# Patient Record
Sex: Male | Born: 1995 | Race: Black or African American | Hispanic: No | Marital: Single | Smoking: Never smoker
Health system: Southern US, Community
[De-identification: ages and names within clinical notes are randomized; demographics above are authoritative.]

## PROBLEM LIST (undated history)

## (undated) DIAGNOSIS — T8859XA Other complications of anesthesia, initial encounter: Secondary | ICD-10-CM

## (undated) DIAGNOSIS — S82209A Unspecified fracture of shaft of unspecified tibia, initial encounter for closed fracture: Secondary | ICD-10-CM

## (undated) HISTORY — PX: DENTAL SURGERY: SHX609

---
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
## 2021-06-21 DIAGNOSIS — T2047XA Corrosion of unspecified degree of neck, initial encounter: Secondary | ICD-10-CM | POA: Insufficient documentation

## 2022-06-27 ENCOUNTER — Ambulatory Visit
Admission: EM | Admit: 2022-06-27 | Discharge: 2022-06-27 | Disposition: A | Discharge status: Home / Self Care | Payer: 59

## 2022-06-27 ENCOUNTER — Emergency Department: Payer: 59

## 2022-06-27 ENCOUNTER — Other Ambulatory Visit: Payer: Self-pay

## 2022-06-27 ENCOUNTER — Encounter: Payer: Self-pay | Admitting: *Deleted

## 2022-06-27 DIAGNOSIS — M25532 Pain in left wrist: Secondary | ICD-10-CM | POA: Diagnosis not present

## 2022-06-27 DIAGNOSIS — S0990XA Unspecified injury of head, initial encounter: Secondary | ICD-10-CM | POA: Diagnosis present
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
## 2022-06-28 ENCOUNTER — Emergency Department
Admission: EM | Admit: 2022-06-28 | Discharge: 2022-06-28 | Disposition: A | Discharge status: Home / Self Care | Payer: 59 | Attending: Emergency Medicine | Admitting: Emergency Medicine

## 2022-06-28 ENCOUNTER — Emergency Department: Payer: 59

## 2022-06-28 DIAGNOSIS — S0990XA Unspecified injury of head, initial encounter: Secondary | ICD-10-CM

## 2022-06-28 DIAGNOSIS — M25532 Pain in left wrist: Secondary | ICD-10-CM

## 2022-06-28 MED ORDER — ACETAMINOPHEN 500 MG PO TABS
1000.0000 mg | ORAL_TABLET | Freq: Once | ORAL | Status: AC
  Filled 2022-06-28: qty 2

## 2022-06-28 MED ORDER — KETOROLAC TROMETHAMINE 30 MG/ML IJ SOLN
30.0000 mg | Freq: Once | INTRAMUSCULAR | Status: AC
  Administered 2022-06-28: 30 mg via INTRAMUSCULAR
  Filled 2022-06-28: qty 1

## 2022-06-28 MED ORDER — OXYCODONE HCL 5 MG PO TABS
5.0000 mg | ORAL_TABLET | Freq: Once | ORAL | Status: AC
  Administered 2022-06-28: 5 mg via ORAL
  Filled 2022-06-28: qty 1

## 2022-06-28 MED ORDER — ACETAMINOPHEN 325 MG PO TABS
ORAL_TABLET | ORAL | Status: AC
  Administered 2022-06-28: 1000 mg via ORAL
  Filled 2022-06-28: qty 2

## 2022-06-28 NOTE — Discharge Instructions (Addendum)
Please take Tylenol and ibuprofen/Advil for your pain.  It is safe to take them together, or to alternate them every few hours.  Take up to 1000mg of Tylenol at a time, up to 4 times per day.  Do not take more than 4000 mg of Tylenol in 24 hours.  For ibuprofen, take 400-600 mg, 3 - 4 times per day.  
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
## 2022-10-04 ENCOUNTER — Emergency Department (HOSPITAL_COMMUNITY): Payer: No Typology Code available for payment source

## 2022-10-04 ENCOUNTER — Other Ambulatory Visit: Payer: Self-pay

## 2022-10-04 ENCOUNTER — Inpatient Hospital Stay (HOSPITAL_COMMUNITY)
Admission: EM | Admit: 2022-10-04 | Discharge: 2022-10-08 | DRG: 563 | Disposition: A | Discharge status: Home / Self Care | Payer: No Typology Code available for payment source | Attending: Internal Medicine | Admitting: Internal Medicine

## 2022-10-04 ENCOUNTER — Encounter (HOSPITAL_COMMUNITY): Payer: Self-pay

## 2022-10-04 DIAGNOSIS — S82831G Other fracture of upper and lower end of right fibula, subsequent encounter for closed fracture with delayed healing: Secondary | ICD-10-CM

## 2022-10-04 DIAGNOSIS — S93324A Dislocation of tarsometatarsal joint of right foot, initial encounter: Secondary | ICD-10-CM | POA: Diagnosis not present

## 2022-10-04 DIAGNOSIS — Z560 Unemployment, unspecified: Secondary | ICD-10-CM

## 2022-10-04 DIAGNOSIS — S93601A Unspecified sprain of right foot, initial encounter: Secondary | ICD-10-CM | POA: Insufficient documentation

## 2022-10-04 DIAGNOSIS — D72828 Other elevated white blood cell count: Secondary | ICD-10-CM | POA: Diagnosis present

## 2022-10-04 DIAGNOSIS — S060X0A Concussion without loss of consciousness, initial encounter: Secondary | ICD-10-CM | POA: Diagnosis present

## 2022-10-04 DIAGNOSIS — K5900 Constipation, unspecified: Secondary | ICD-10-CM | POA: Diagnosis not present

## 2022-10-04 DIAGNOSIS — S92901A Unspecified fracture of right foot, initial encounter for closed fracture: Secondary | ICD-10-CM

## 2022-10-04 DIAGNOSIS — S20211A Contusion of right front wall of thorax, initial encounter: Secondary | ICD-10-CM | POA: Diagnosis present

## 2022-10-04 DIAGNOSIS — S82831A Other fracture of upper and lower end of right fibula, initial encounter for closed fracture: Principal | ICD-10-CM | POA: Diagnosis present

## 2022-10-04 DIAGNOSIS — S060XAA Concussion with loss of consciousness status unknown, initial encounter: Secondary | ICD-10-CM

## 2022-10-04 DIAGNOSIS — Y92410 Unspecified street and highway as the place of occurrence of the external cause: Secondary | ICD-10-CM

## 2022-10-04 LAB — RAPID URINE DRUG SCREEN, HOSP PERFORMED
Amphetamines: NOT DETECTED
Barbiturates: NOT DETECTED
Benzodiazepines: NOT DETECTED
Cocaine: NOT DETECTED
Opiates: NOT DETECTED
Tetrahydrocannabinol: NOT DETECTED

## 2022-10-04 LAB — CBC
HCT: 46.1 % (ref 39.0–52.0)
Hemoglobin: 14 g/dL (ref 13.0–17.0)
MCH: 22 pg — ABNORMAL LOW (ref 26.0–34.0)
MCHC: 30.4 g/dL (ref 30.0–36.0)
MCV: 72.4 fL — ABNORMAL LOW (ref 80.0–100.0)
Platelets: 298 10*3/uL (ref 150–400)
RBC: 6.37 MIL/uL — ABNORMAL HIGH (ref 4.22–5.81)
RDW: 16.2 % — ABNORMAL HIGH (ref 11.5–15.5)
WBC: 9.7 10*3/uL (ref 4.0–10.5)
nRBC: 0 % (ref 0.0–0.2)

## 2022-10-04 LAB — I-STAT CHEM 8, ED
BUN: 7 mg/dL (ref 6–20)
Calcium, Ion: 1.17 mmol/L (ref 1.15–1.40)
Chloride: 105 mmol/L (ref 98–111)
Creatinine, Ser: 1.2 mg/dL (ref 0.61–1.24)
Glucose, Bld: 115 mg/dL — ABNORMAL HIGH (ref 70–99)
HCT: 48 % (ref 39.0–52.0)
Hemoglobin: 16.3 g/dL (ref 13.0–17.0)
Potassium: 3.5 mmol/L (ref 3.5–5.1)
Sodium: 141 mmol/L (ref 135–145)
TCO2: 27 mmol/L (ref 22–32)

## 2022-10-04 LAB — COMPREHENSIVE METABOLIC PANEL
ALT: 15 U/L (ref 0–44)
AST: 21 U/L (ref 15–41)
Albumin: 3.9 g/dL (ref 3.5–5.0)
Alkaline Phosphatase: 71 U/L (ref 38–126)
Anion gap: 9 (ref 5–15)
BUN: 8 mg/dL (ref 6–20)
CO2: 24 mmol/L (ref 22–32)
Calcium: 9.1 mg/dL (ref 8.9–10.3)
Chloride: 104 mmol/L (ref 98–111)
Creatinine, Ser: 1.16 mg/dL (ref 0.61–1.24)
GFR, Estimated: >60 mL/min (ref >60)
Glucose, Bld: 108 mg/dL — ABNORMAL HIGH (ref 70–99)
Potassium: 3.6 mmol/L (ref 3.5–5.1)
Sodium: 137 mmol/L (ref 135–145)
Total Bilirubin: 0.7 mg/dL (ref 0.3–1.2)
Total Protein: 7.2 g/dL (ref 6.5–8.1)

## 2022-10-04 LAB — URINALYSIS, ROUTINE W REFLEX MICROSCOPIC
Bilirubin Urine: NEGATIVE
Glucose, UA: NEGATIVE mg/dL
Hgb urine dipstick: NEGATIVE
Ketones, ur: NEGATIVE mg/dL
Leukocytes,Ua: NEGATIVE
Nitrite: NEGATIVE
Protein, ur: NEGATIVE mg/dL
Specific Gravity, Urine: >1.046 — ABNORMAL HIGH (ref 1.005–1.030)
pH: 6 (ref 5.0–8.0)

## 2022-10-04 LAB — PROTIME-INR
INR: 1 (ref 0.8–1.2)
Prothrombin Time: 13.7 seconds (ref 11.4–15.2)

## 2022-10-04 LAB — SAMPLE TO BLOOD BANK

## 2022-10-04 LAB — ETHANOL: Alcohol, Ethyl (B): <10 mg/dL (ref <10)

## 2022-10-04 LAB — LACTIC ACID, PLASMA: Lactic Acid, Venous: 1.9 mmol/L (ref 0.5–1.9)

## 2022-10-04 MED ORDER — HYDROMORPHONE HCL 1 MG/ML IJ SOLN
1.0000 mg | Freq: Once | INTRAMUSCULAR | Status: AC
  Administered 2022-10-04: 1 mg via INTRAVENOUS
  Filled 2022-10-04: qty 1

## 2022-10-04 MED ORDER — KETOROLAC TROMETHAMINE 15 MG/ML IJ SOLN
15.0000 mg | Freq: Once | INTRAMUSCULAR | Status: AC
  Administered 2022-10-04: 15 mg via INTRAVENOUS
  Filled 2022-10-04: qty 1

## 2022-10-04 MED ORDER — FENTANYL CITRATE PF 50 MCG/ML IJ SOSY
100.0000 ug | PREFILLED_SYRINGE | Freq: Once | INTRAMUSCULAR | Status: AC
  Administered 2022-10-04: 100 ug via INTRAVENOUS
  Filled 2022-10-04: qty 2

## 2022-10-04 MED ORDER — TETANUS-DIPHTH-ACELL PERTUSSIS 5-2.5-18.5 LF-MCG/0.5 IM SUSY
0.5000 mL | PREFILLED_SYRINGE | Freq: Once | INTRAMUSCULAR | Status: DC
  Filled 2022-10-04: qty 0.5

## 2022-10-04 MED ORDER — IOHEXOL 350 MG/ML SOLN
75.0000 mL | Freq: Once | INTRAVENOUS | Status: AC | PRN
  Administered 2022-10-04: 75 mL via INTRAVENOUS

## 2022-10-04 MED ORDER — OXYCODONE HCL 5 MG PO TABS: 5.0000 mg | ORAL_TABLET | ORAL | 0 refills | Status: DC | PRN

## 2022-10-04 MED ORDER — ONDANSETRON HCL 4 MG/2ML IJ SOLN
4.0000 mg | Freq: Once | INTRAMUSCULAR | Status: AC
  Administered 2022-10-04: 4 mg via INTRAVENOUS
  Filled 2022-10-04: qty 2

## 2022-10-04 MED ORDER — OXYCODONE HCL 5 MG PO TABS
5.0000 mg | ORAL_TABLET | ORAL | Status: AC
  Administered 2022-10-04: 5 mg via ORAL
  Filled 2022-10-04: qty 1

## 2022-10-04 MED ORDER — HYDROMORPHONE HCL 1 MG/ML IJ SOLN
0.5000 mg | Freq: Once | INTRAMUSCULAR | Status: AC
  Administered 2022-10-04: 0.5 mg via INTRAVENOUS
  Filled 2022-10-04: qty 1

## 2022-10-04 NOTE — ED Notes (Signed)
Pt returned from CT placed back on monitor. Pt remains in c-collar at this time. VSS,NAD.
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
## 2022-10-04 NOTE — Progress Notes (Signed)
Orthopedic Tech Progress Note Patient Details:  Wesley Schmidt December 17, 1995 [id number]  Applied short leg splint and gave patient crutches. Ortho Devices Type of Ortho Device: Short leg splint, Crutches Ortho Device/Splint Location: RLE Ortho Device/Splint Interventions: Ordered, Application, Adjustment   Post Interventions Patient Tolerated: Well Instructions Provided: Care of device  Blase Mess 10/04/2022, 7:17 PM

## 2022-10-04 NOTE — ED Notes (Signed)
MD Eloise Harman at bedside evaluating pt.
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
## 2022-10-04 NOTE — ED Notes (Signed)
Pt transported to CT via stretcher with RN. C-spine precautions remain intact at this time. Pt a&ox4,vss,nad.

## 2022-10-04 NOTE — ED Notes (Signed)
Pt alert and oriented to 4, c/o being exhausted. Vitals wnl.

## 2022-10-04 NOTE — ED Notes (Signed)
[employer] officer at bedside  
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
## 2022-10-05 ENCOUNTER — Encounter (HOSPITAL_COMMUNITY): Payer: Self-pay | Admitting: Internal Medicine

## 2022-10-05 DIAGNOSIS — K5900 Constipation, unspecified: Secondary | ICD-10-CM | POA: Diagnosis not present

## 2022-10-05 DIAGNOSIS — S82831A Other fracture of upper and lower end of right fibula, initial encounter for closed fracture: Secondary | ICD-10-CM | POA: Diagnosis present

## 2022-10-05 DIAGNOSIS — S20211A Contusion of right front wall of thorax, initial encounter: Secondary | ICD-10-CM

## 2022-10-05 DIAGNOSIS — S93324A Dislocation of tarsometatarsal joint of right foot, initial encounter: Secondary | ICD-10-CM | POA: Diagnosis present

## 2022-10-05 DIAGNOSIS — Z560 Unemployment, unspecified: Secondary | ICD-10-CM | POA: Diagnosis not present

## 2022-10-05 DIAGNOSIS — S92901A Unspecified fracture of right foot, initial encounter for closed fracture: Secondary | ICD-10-CM

## 2022-10-05 DIAGNOSIS — Y92410 Unspecified street and highway as the place of occurrence of the external cause: Secondary | ICD-10-CM | POA: Diagnosis not present

## 2022-10-05 DIAGNOSIS — D72828 Other elevated white blood cell count: Secondary | ICD-10-CM | POA: Diagnosis present

## 2022-10-05 DIAGNOSIS — S82831G Other fracture of upper and lower end of right fibula, subsequent encounter for closed fracture with delayed healing: Secondary | ICD-10-CM

## 2022-10-05 DIAGNOSIS — S82831D Other fracture of upper and lower end of right fibula, subsequent encounter for closed fracture with routine healing: Secondary | ICD-10-CM

## 2022-10-05 DIAGNOSIS — S060X0A Concussion without loss of consciousness, initial encounter: Secondary | ICD-10-CM | POA: Diagnosis present

## 2022-10-05 HISTORY — DX: Contusion of right front wall of thorax, initial encounter: S20.211A

## 2022-10-05 LAB — CBC WITH DIFFERENTIAL/PLATELET
Abs Immature Granulocytes: 0.06 10*3/uL (ref 0.00–0.07)
Basophils Absolute: 0 10*3/uL (ref 0.0–0.1)
Basophils Relative: 0 %
Eosinophils Absolute: 0 10*3/uL (ref 0.0–0.5)
Eosinophils Relative: 0 %
HCT: 46.6 % (ref 39.0–52.0)
Hemoglobin: 14.3 g/dL (ref 13.0–17.0)
Immature Granulocytes: 0 %
Lymphocytes Relative: 4 %
Lymphs Abs: 0.7 10*3/uL (ref 0.7–4.0)
MCH: 23 pg — ABNORMAL LOW (ref 26.0–34.0)
MCHC: 30.7 g/dL (ref 30.0–36.0)
MCV: 74.8 fL — ABNORMAL LOW (ref 80.0–100.0)
Monocytes Absolute: 0.9 10*3/uL (ref 0.1–1.0)
Monocytes Relative: 6 %
Neutro Abs: 13.4 10*3/uL — ABNORMAL HIGH (ref 1.7–7.7)
Neutrophils Relative %: 90 %
Platelets: 217 10*3/uL (ref 150–400)
RBC: 6.23 MIL/uL — ABNORMAL HIGH (ref 4.22–5.81)
RDW: 15.9 % — ABNORMAL HIGH (ref 11.5–15.5)
WBC: 15 10*3/uL — ABNORMAL HIGH (ref 4.0–10.5)
nRBC: 0 % (ref 0.0–0.2)

## 2022-10-05 LAB — BASIC METABOLIC PANEL
Anion gap: 12 (ref 5–15)
BUN: 6 mg/dL (ref 6–20)
CO2: 23 mmol/L (ref 22–32)
Calcium: 9 mg/dL (ref 8.9–10.3)
Chloride: 102 mmol/L (ref 98–111)
Creatinine, Ser: 1.12 mg/dL (ref 0.61–1.24)
GFR, Estimated: >60 mL/min (ref >60)
Glucose, Bld: 133 mg/dL — ABNORMAL HIGH (ref 70–99)
Potassium: 3.7 mmol/L (ref 3.5–5.1)
Sodium: 137 mmol/L (ref 135–145)

## 2022-10-05 LAB — MAGNESIUM: Magnesium: 1.7 mg/dL (ref 1.7–2.4)

## 2022-10-05 MED ORDER — ONDANSETRON HCL 4 MG/2ML IJ SOLN: 4.0000 mg | Freq: Three times a day (TID) | INTRAMUSCULAR | Status: DC | PRN

## 2022-10-05 MED ORDER — KETOROLAC TROMETHAMINE 15 MG/ML IJ SOLN: 15.0000 mg | Freq: Four times a day (QID) | INTRAMUSCULAR | Status: DC | PRN

## 2022-10-05 MED ORDER — SODIUM CHLORIDE 0.9 % IV SOLN
INTRAVENOUS | Status: DC
  Administered 2022-10-05: 125 mL/h via INTRAVENOUS

## 2022-10-05 MED ORDER — PANTOPRAZOLE SODIUM 40 MG PO TBEC
40.0000 mg | DELAYED_RELEASE_TABLET | Freq: Every day | ORAL | Status: DC
  Administered 2022-10-05 – 2022-10-08 (×4): 40 mg via ORAL
  Filled 2022-10-05 (×4): qty 1

## 2022-10-05 MED ORDER — IBUPROFEN 200 MG PO TABS: 600.0000 mg | ORAL_TABLET | Freq: Four times a day (QID) | ORAL | Status: DC | PRN

## 2022-10-05 MED ORDER — ACETAMINOPHEN 325 MG PO TABS
650.0000 mg | ORAL_TABLET | Freq: Four times a day (QID) | ORAL | Status: DC | PRN
  Administered 2022-10-07: 650 mg via ORAL
  Filled 2022-10-05: qty 2

## 2022-10-05 MED ORDER — HYDROMORPHONE HCL 1 MG/ML IJ SOLN
1.0000 mg | INTRAMUSCULAR | Status: DC | PRN
  Administered 2022-10-05 (×2): 1 mg via INTRAVENOUS
  Filled 2022-10-05 (×2): qty 1

## 2022-10-05 MED ORDER — ONDANSETRON HCL 4 MG/2ML IJ SOLN
4.0000 mg | Freq: Four times a day (QID) | INTRAMUSCULAR | Status: DC | PRN
  Administered 2022-10-05 – 2022-10-07 (×3): 4 mg via INTRAVENOUS
  Filled 2022-10-05 (×4): qty 2

## 2022-10-05 MED ORDER — IBUPROFEN 200 MG PO TABS
600.0000 mg | ORAL_TABLET | Freq: Three times a day (TID) | ORAL | Status: DC
  Administered 2022-10-05 – 2022-10-08 (×10): 600 mg via ORAL
  Filled 2022-10-05 (×10): qty 3

## 2022-10-05 MED ORDER — HYDROMORPHONE HCL 1 MG/ML IJ SOLN: 1.0000 mg | INTRAMUSCULAR | Status: DC | PRN

## 2022-10-05 MED ORDER — OXYCODONE HCL 5 MG PO TABS
5.0000 mg | ORAL_TABLET | ORAL | Status: DC | PRN
  Administered 2022-10-05 – 2022-10-06 (×2): 5 mg via ORAL
  Administered 2022-10-06 – 2022-10-07 (×4): 10 mg via ORAL
  Filled 2022-10-05 (×3): qty 1
  Filled 2022-10-05 (×4): qty 2

## 2022-10-05 MED ORDER — OXYCODONE HCL 5 MG PO TABS: 5.0000 mg | ORAL_TABLET | ORAL | Status: DC | PRN

## 2022-10-05 MED ORDER — ENOXAPARIN SODIUM 60 MG/0.6ML IJ SOSY
60.0000 mg | PREFILLED_SYRINGE | Freq: Every day | INTRAMUSCULAR | Status: DC
  Administered 2022-10-05 – 2022-10-07 (×3): 60 mg via SUBCUTANEOUS
  Filled 2022-10-05 (×3): qty 0.6

## 2022-10-05 MED ORDER — LORAZEPAM 2 MG/ML IJ SOLN: 0.5000 mg | Freq: Four times a day (QID) | INTRAMUSCULAR | Status: DC | PRN

## 2022-10-05 MED ORDER — KETOROLAC TROMETHAMINE 15 MG/ML IJ SOLN: 30.0000 mg | Freq: Four times a day (QID) | INTRAMUSCULAR | Status: DC | PRN

## 2022-10-05 MED ORDER — SODIUM CHLORIDE 0.9 % IV BOLUS
500.0000 mL | Freq: Once | INTRAVENOUS | Status: AC
  Administered 2022-10-05: 500 mL via INTRAVENOUS
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
## 2022-10-05 NOTE — Plan of Care (Signed)
  Problem: Education: Goal: Knowledge of General Education information will improve Description: Including pain rating scale, medication(s)/side effects and non-pharmacologic comfort measures Outcome: Progressing Patient educated on pain scale and medications. Patient used scale to rate pain.   Problem: Activity: Goal: Risk for activity intolerance will decrease Outcome: Progressing.    Problem: Nutrition: Goal: Adequate nutrition will be maintained Outcome: Progressing   Problem: Coping: Goal: Level of anxiety will decrease Outcome: Progressing
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
## 2022-10-05 NOTE — Progress Notes (Signed)
Patient is requesting girlfriend Jon Gills not longer be allowed to visit or have information regarding care. Consulting civil engineer notified. Patient password placed. Secretary placed as private.

## 2022-10-06 DIAGNOSIS — D72829 Elevated white blood cell count, unspecified: Secondary | ICD-10-CM

## 2022-10-06 DIAGNOSIS — K5900 Constipation, unspecified: Secondary | ICD-10-CM

## 2022-10-06 DIAGNOSIS — S20211D Contusion of right front wall of thorax, subsequent encounter: Secondary | ICD-10-CM

## 2022-10-06 LAB — BASIC METABOLIC PANEL
Anion gap: 7 (ref 5–15)
BUN: 9 mg/dL (ref 6–20)
CO2: 25 mmol/L (ref 22–32)
Calcium: 8.5 mg/dL — ABNORMAL LOW (ref 8.9–10.3)
Chloride: 105 mmol/L (ref 98–111)
Creatinine, Ser: 1.22 mg/dL (ref 0.61–1.24)
GFR, Estimated: >60 mL/min (ref >60)
Glucose, Bld: 101 mg/dL — ABNORMAL HIGH (ref 70–99)
Potassium: 4.2 mmol/L (ref 3.5–5.1)
Sodium: 137 mmol/L (ref 135–145)

## 2022-10-06 LAB — CBC WITH DIFFERENTIAL/PLATELET
Abs Immature Granulocytes: 0.02 10*3/uL (ref 0.00–0.07)
Basophils Absolute: 0 10*3/uL (ref 0.0–0.1)
Basophils Relative: 0 %
Eosinophils Absolute: 0.1 10*3/uL (ref 0.0–0.5)
Eosinophils Relative: 1 %
HCT: 39 % (ref 39.0–52.0)
Hemoglobin: 12.1 g/dL — ABNORMAL LOW (ref 13.0–17.0)
Immature Granulocytes: 0 %
Lymphocytes Relative: 21 %
Lymphs Abs: 1.9 10*3/uL (ref 0.7–4.0)
MCH: 22.9 pg — ABNORMAL LOW (ref 26.0–34.0)
MCHC: 31 g/dL (ref 30.0–36.0)
MCV: 73.9 fL — ABNORMAL LOW (ref 80.0–100.0)
Monocytes Absolute: 0.8 10*3/uL (ref 0.1–1.0)
Monocytes Relative: 9 %
Neutro Abs: 6.3 10*3/uL (ref 1.7–7.7)
Neutrophils Relative %: 69 %
Platelets: 196 10*3/uL (ref 150–400)
RBC: 5.28 MIL/uL (ref 4.22–5.81)
RDW: 15.4 % (ref 11.5–15.5)
WBC: 9.1 10*3/uL (ref 4.0–10.5)
nRBC: 0 % (ref 0.0–0.2)

## 2022-10-06 LAB — MAGNESIUM: Magnesium: 1.7 mg/dL (ref 1.7–2.4)

## 2022-10-06 MED ORDER — SORBITOL 70 % SOLN
30.0000 mL | Status: AC
  Administered 2022-10-06: 30 mL via ORAL
  Filled 2022-10-06 (×2): qty 30

## 2022-10-06 MED ORDER — SENNOSIDES-DOCUSATE SODIUM 8.6-50 MG PO TABS
1.0000 | ORAL_TABLET | Freq: Two times a day (BID) | ORAL | Status: DC
  Administered 2022-10-06 – 2022-10-07 (×3): 1 via ORAL
  Filled 2022-10-06 (×5): qty 1

## 2022-10-06 MED ORDER — POLYETHYLENE GLYCOL 3350 17 G PO PACK
17.0000 g | PACK | Freq: Every day | ORAL | Status: DC
  Administered 2022-10-06: 17 g via ORAL
  Filled 2022-10-06: qty 1

## 2022-10-06 MED ORDER — POLYETHYLENE GLYCOL 3350 17 G PO PACK
17.0000 g | PACK | Freq: Two times a day (BID) | ORAL | Status: DC
  Administered 2022-10-07 (×2): 17 g via ORAL
  Filled 2022-10-06 (×4): qty 1

## 2022-10-06 MED ORDER — MAGNESIUM SULFATE 2 GM/50ML IV SOLN
2.0000 g | Freq: Once | INTRAVENOUS | Status: AC
  Administered 2022-10-06: 2 g via INTRAVENOUS
  Filled 2022-10-06: qty 50
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
## 2022-10-07 ENCOUNTER — Other Ambulatory Visit (HOSPITAL_COMMUNITY): Payer: Self-pay

## 2022-10-07 LAB — CBC
HCT: 38.2 % — ABNORMAL LOW (ref 39.0–52.0)
Hemoglobin: 11.5 g/dL — ABNORMAL LOW (ref 13.0–17.0)
MCH: 22.1 pg — ABNORMAL LOW (ref 26.0–34.0)
MCHC: 30.1 g/dL (ref 30.0–36.0)
MCV: 73.5 fL — ABNORMAL LOW (ref 80.0–100.0)
Platelets: 198 10*3/uL (ref 150–400)
RBC: 5.2 MIL/uL (ref 4.22–5.81)
RDW: 15.3 % (ref 11.5–15.5)
WBC: 7.8 10*3/uL (ref 4.0–10.5)
nRBC: 0 % (ref 0.0–0.2)

## 2022-10-07 LAB — BASIC METABOLIC PANEL
Anion gap: 8 (ref 5–15)
BUN: 8 mg/dL (ref 6–20)
CO2: 24 mmol/L (ref 22–32)
Calcium: 8.7 mg/dL — ABNORMAL LOW (ref 8.9–10.3)
Chloride: 104 mmol/L (ref 98–111)
Creatinine, Ser: 1.14 mg/dL (ref 0.61–1.24)
GFR, Estimated: >60 mL/min (ref >60)
Glucose, Bld: 95 mg/dL (ref 70–99)
Potassium: 3.8 mmol/L (ref 3.5–5.1)
Sodium: 136 mmol/L (ref 135–145)

## 2022-10-07 LAB — MAGNESIUM: Magnesium: 2 mg/dL (ref 1.7–2.4)

## 2022-10-07 MED ORDER — SENNOSIDES-DOCUSATE SODIUM 8.6-50 MG PO TABS: 1.0000 | ORAL_TABLET | Freq: Two times a day (BID) | ORAL | Status: DC

## 2022-10-07 MED ORDER — ONDANSETRON HCL 4 MG PO TABS
4.0000 mg | ORAL_TABLET | Freq: Three times a day (TID) | ORAL | 0 refills | Status: DC | PRN
  Filled 2022-10-07: qty 20, 7d supply, fill #0

## 2022-10-07 MED ORDER — SORBITOL 70 % SOLN
30.0000 mL | Status: DC
  Filled 2022-10-07 (×2): qty 30

## 2022-10-07 MED ORDER — PANTOPRAZOLE SODIUM 40 MG PO TBEC
40.0000 mg | DELAYED_RELEASE_TABLET | Freq: Every day | ORAL | 1 refills | Status: AC
  Filled 2022-10-07: qty 30, 30d supply, fill #0

## 2022-10-07 MED ORDER — SORBITOL 70 % SOLN: 30.0000 mL | Freq: Every day | Status: DC | PRN

## 2022-10-07 MED ORDER — OXYCODONE HCL 5 MG PO TABS
5.0000 mg | ORAL_TABLET | ORAL | Status: DC | PRN
  Administered 2022-10-07: 5 mg via ORAL
  Filled 2022-10-07: qty 1

## 2022-10-07 MED ORDER — SODIUM CHLORIDE 0.9 % IV SOLN: INTRAVENOUS | Status: DC

## 2022-10-07 MED ORDER — IBUPROFEN 600 MG PO TABS: 600.0000 mg | ORAL_TABLET | Freq: Three times a day (TID) | ORAL | 0 refills | Status: AC

## 2022-10-07 MED ORDER — POLYETHYLENE GLYCOL 3350 17 GM/SCOOP PO POWD
17.0000 g | Freq: Every day | ORAL | 0 refills | Status: DC | PRN
  Filled 2022-10-07: qty 238, 14d supply, fill #0

## 2022-10-07 MED ORDER — SODIUM CHLORIDE 0.9 % IV BOLUS
1000.0000 mL | Freq: Once | INTRAVENOUS | Status: AC
  Administered 2022-10-07: 1000 mL via INTRAVENOUS

## 2022-10-07 MED ORDER — BISACODYL 10 MG RE SUPP: 10.0000 mg | Freq: Once | RECTAL | Status: DC

## 2022-10-07 NOTE — Progress Notes (Signed)
   10/07/22 1112  Spiritual Encounters  Type of Visit Initial  Care provided to: Pt and family  Referral source Patient request  Reason for visit Advance directives  OnCall Visit No   Chaplain responded to PT request for information on AC/HCPOA.  Chaplain provided AD paperwork and delivered education to both PT and his mother.  Chaplain answered various queations they had regarding wha the HCPOA does and does not cover, and also provided education on the living will portion of the document.  Noted to PT and mother that we have a notary today, but we will not have one tomorrow.  PT anticipates discharge tomorrow, thus chaplain advised that paperwork should competed quickly if they want it done prior to that.

## 2022-10-07 NOTE — Progress Notes (Signed)
   10/07/22 1300  Orthostatic Lying   BP- Lying 115/56  Pulse- Lying 63  Orthostatic Sitting  BP- Sitting 124/66  Pulse- Sitting 68  Orthostatic Standing at 0 minutes  BP- Standing at 0 minutes 116/65  Pulse- Standing at 0 minutes 90  Orthostatic Standing at 3 minutes  BP- Standing at 3 minutes 119/88  Pulse- Standing at 3 minutes 100    Wesley Schmidt S, PT DPT Acute Rehabilitation Services Secure Chat Preferred  Office [phone]
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
## 2022-10-07 NOTE — TOC Progression Note (Addendum)
Transition of Care [facility]) - Progression Note    Patient Details  Name: Wesley Schmidt MRN: [id number] Date of Birth: [birth year]  Transition of Care [facility]) CM/SW Contact  Epifanio Lesches, RN Phone Number: 10/07/2022, 4:11 PM  Clinical Narrative:    Referral made with Mitch/ [facility] for [facility]. LOG received from [facility] leadership. Equipment will be delivered to beside prior to d/c.     Expected Discharge Plan and Services   Discharge Planning Services: CM Consult                     DME Arranged: Bedside commode (LOG/ approved by [facility] [employer])   Date DME Agency Contacted: 10/07/22 Time DME Agency Contacted: 1200 Representative spoke with at DME Agency: Marthann Schiller             Social Determinants of Health (SDOH) Interventions SDOH Screenings   Food Insecurity: Patient Declined (10/05/2022)  Housing: Low Risk  (10/05/2022)  Transportation Needs: No Transportation Needs (10/05/2022)  Utilities: Not At Risk (10/05/2022)  Tobacco Use: Unknown (10/05/2022)    Readmission Risk Interventions     No data to display

## 2022-10-08 ENCOUNTER — Other Ambulatory Visit (HOSPITAL_COMMUNITY): Payer: Self-pay

## 2022-10-08 DIAGNOSIS — K5900 Constipation, unspecified: Secondary | ICD-10-CM

## 2022-10-08 NOTE — TOC Transition Note (Signed)
Transition of Care [facility]) - CM/SW Discharge Note   Patient Details  Name: Wesley Schmidt MRN: [id number] Date of Birth: 04/08/1996  [facility]) CM/SW Contact:  Ronny Bacon, RN Phone Number: 10/08/2022, 1:22 PM   Clinical Narrative:  Spoke with patient and mom. Patient is being discharged home today. Mom confirms that patient has Crutches and BSC at bedside. Mom inquired about physical therapy services and did not have a preference for a facility. Referral placed for [facility] outpatient rehab service on [address] and information added to AVS. Mom will transport patient home at discharge.    Final next level of care: Home/Self Care Barriers to Discharge: No Barriers Identified   Patient Goals and CMS Choice      Discharge Placement                         Discharge Plan and Services Additional resources added to the After Visit Summary for     Discharge Planning Services: CM Consult            DME Arranged: Bedside commode (LOG/ approved by [facility] [employer])   Date DME Agency Contacted: 10/07/22 Time DME Agency Contacted: 1200 Representative spoke with at DME Agency: Marthann Schiller            Social Determinants of Health (SDOH) Interventions SDOH Screenings   Food Insecurity: Patient Declined (10/05/2022)  Housing: Low Risk  (10/05/2022)  Transportation Needs: No Transportation Needs (10/05/2022)  Utilities: Not At Risk (10/05/2022)  Tobacco Use: Unknown (10/05/2022)     Readmission Risk Interventions     No data to display

## 2022-10-08 NOTE — Discharge Summary (Signed)
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 "AMYLASE" in the last 168 hours. No results for input(s): "AMMONIA" in the last 168 hours. CBC: Recent Labs  Lab 10/04/22 1543 10/04/22 1613 10/05/22 0033 10/06/22 0058 10/07/22 0638  WBC 9.7  --  15.0* 9.1 7.8  NEUTROABS  --   --  13.4* 6.3  --   HGB 14.0 16.3 14.3 12.1* 11.5*  HCT 46.1 48.0 46.6 39.0 38.2*  MCV 72.4*  --  74.8* 73.9* 73.5*  PLT 298  --  217 196 198   Cardiac Enzymes: No results for input(s): "CKTOTAL", "CKMB", "CKMBINDEX", "TROPONINI" in the last 168 hours. BNP: BNP (last 3 results) No results for input(s): "BNP" in the last 8760 hours.  ProBNP (last 3 results) No results for input(s): "PROBNP" in the last 8760 hours.  CBG: No results for input(s): "GLUCAP" in the last 168 hours.     Signed:  Ramiro Harvest MD.  [facility] 10/08/2022, 2:21 PM

## 2022-10-08 NOTE — Plan of Care (Signed)
  Problem: Education: Goal: Knowledge of General Education information will improve Description: Including pain rating scale, medication(s)/side effects and non-pharmacologic comfort measures Outcome: Progressing   Problem: Health Behavior/Discharge Planning: Goal: Ability to manage health-related needs will improve Outcome: Progressing   Problem: Nutrition: Goal: Adequate nutrition will be maintained Outcome: Progressing   Problem: Coping: Goal: Level of anxiety will decrease Outcome: Progressing   Problem: Elimination: Goal: Will not experience complications related to bowel motility Outcome: Progressing Goal: Will not experience complications related to urinary retention Outcome: Progressing   Problem: Pain Managment: Goal: General experience of comfort will improve Outcome: Progressing   Problem: Safety: Goal: Ability to remain free from injury will improve Outcome: Progressing   

## 2022-10-13 DIAGNOSIS — G90529 Complex regional pain syndrome I of unspecified lower limb: Secondary | ICD-10-CM | POA: Insufficient documentation

## 2022-10-13 DIAGNOSIS — M25571 Pain in right ankle and joints of right foot: Secondary | ICD-10-CM | POA: Insufficient documentation

## 2022-10-13 DIAGNOSIS — M79671 Pain in right foot: Secondary | ICD-10-CM | POA: Insufficient documentation

## 2022-10-14 ENCOUNTER — Encounter (HOSPITAL_BASED_OUTPATIENT_CLINIC_OR_DEPARTMENT_OTHER): Payer: Self-pay | Admitting: Orthopaedic Surgery

## 2022-10-14 ENCOUNTER — Other Ambulatory Visit: Payer: Self-pay

## 2022-10-15 NOTE — Discharge Instructions (Signed)
Renatha Rosen, MD [facility]  Please read the following information regarding your care after surgery.  Medications  You only need a prescription for the narcotic pain medicine (ex. oxycodone, Percocet, Norco).  All of the other medicines listed below are available over the counter. ? Aleve 2 pills twice a day for the first 3 days after surgery. ? acetominophen (Tylenol) 650 mg every 4-6 hours as you need for minor to moderate pain ? oxycodone as prescribed for severe pain  ? To help prevent blood clots, take aspirin (81 mg) twice daily for 42 days after surgery (or total duration of nonweightbearing).  You should also get up every hour while you are awake to move around.  Weight Bearing ? Do NOT bear any weight on the operated leg or foot. This means do NOT touch your surgical leg to the ground!  Cast / Splint / Dressing ? If you have a splint, do NOT remove this. Keep your splint, cast or dressing clean and dry.  Don't put anything (coat hanger, pencil, etc) down inside of it.  If it gets wet, call the office immediately to schedule an appointment for a cast change.  Swelling IMPORTANT: It is normal for you to have swelling where you had surgery. To reduce swelling and pain, keep at least 3 pillows under your leg so that your toes are above your nose and your heel is above the level of your hip.  It may be necessary to keep your foot or leg elevated for several weeks.  This is critical to helping your incisions heal and your pain to feel better.  Follow Up Call my office at [phone] when you are discharged from the hospital or surgery center to schedule an appointment to be seen 7-10 days after surgery.  Call my office at [phone] if you develop a fever >101.5 F, nausea, vomiting, bleeding from the surgical site or severe pain.     

## 2022-10-15 NOTE — H&P (Addendum)
ORTHOPAEDIC SURGERY H&P  Subjective:  The patient presents with right ankle and midfoot injury.   Past Medical History:  Diagnosis Date   Complication of anesthesia    Fx shaft tibia-closed    lt    Past Surgical History:  Procedure Laterality Date   DENTAL SURGERY     GANGLION CYST EXCISION  02/16/2012   Procedure: REMOVAL GANGLION OF WRIST;  Surgeon: Judie Petit. Leonia Corona, MD;  Location: [facility];  Service: Pediatrics;  Laterality: Right;  EXCISION OF GANGLiON CYST OF RIGHT DORSAL WRIST     (Not in an outpatient encounter)    No Known Allergies  Social History   Socioeconomic History   Marital status: Single    Spouse name: Not on file   Number of children: Not on file   Years of education: Not on file   Highest education level: Not on file  Occupational History   Not on file  Tobacco Use   Smoking status: Never   Smokeless tobacco: Not on file  Vaping Use   Vaping Use: Never used  Substance and Sexual Activity   Alcohol use: No   Drug use: No   Sexual activity: Never  Other Topics Concern   Not on file  Social History Narrative   Not on file   Social Determinants of Health   Financial Resource Strain: Not on file  Food Insecurity: Patient Declined (10/05/2022)   Hunger Vital Sign    Worried About Running Out of Food in the Last Year: Patient declined    Ran Out of Food in the Last Year: Patient declined  Transportation Needs: No Transportation Needs (10/05/2022)   PRAPARE - Administrator, Civil Service (Medical): No    Lack of Transportation (Non-Medical): No  Physical Activity: Not on file  Stress: Not on file  Social Connections: Not on file  Intimate Partner Violence: Not At Risk (10/05/2022)   Humiliation, Afraid, Rape, and Kick questionnaire    Fear of Current or Ex-Partner: No    Emotionally Abused: No    Physically Abused: No    Sexually Abused: No     History reviewed. No pertinent family history.   Review of  Systems Pertinent items are noted in HPI.  Objective: Vital signs in last 24 hours:    10/14/2022    9:57 AM 10/08/2022    5:00 AM 10/07/2022    7:00 PM  Vitals with BMI  Height 6\' 1"     Weight 253 lbs 5 oz 253 lbs 5 oz   BMI 33.43 33.43   Systolic  127 131  Diastolic  61 77  Pulse  62 82      EXAM: General: Well nourished, well developed. Awake, alert and oriented to time, place, person. Normal mood and affect. No apparent distress. Breathing room air.  Operative Lower Extremity: Alignment - Neutral Deformity - None Skin intact Tenderness to palpation - right ankle and midfoot  5/5 TA, PT, GS, Per, EHL, FHL Sensation intact to light touch throughout Palpable DP and PT pulses Special testing: None  The contralateral foot/ankle was examined for comparison and noted to be neurovascularly intact with no localized deformity, swelling, or tenderness.  Imaging Review All images taken were independently reviewed by me.  Assessment/Plan: The clinical and radiographic findings were reviewed and discussed at length with the patient.  The patient has right ankle and midfoot injury.  We spoke at length about the natural course of these findings. We discussed nonoperative  and operative treatment options in detail.  The risks and benefits were presented and reviewed. The risks due to implant failure/irritation, infection, stiffness, nerve/vessel/tendon injury, wound healing issues, failure of this surgery, need for further surgery, thromboembolic events, amputation, death among others were discussed. The patient acknowledged the explanation and agreed to proceed with the plan.  Wesley Schmidt  Orthopaedic Surgery [facility]

## 2022-10-17 ENCOUNTER — Other Ambulatory Visit (HOSPITAL_COMMUNITY): Payer: Self-pay

## 2022-10-17 MED ORDER — CHLORHEXIDINE GLUCONATE 4 % EX SOLN: 60.0000 mL | Freq: Once | CUTANEOUS | Status: DC

## 2022-10-17 NOTE — Progress Notes (Signed)
Surgical soap given with instructions, pt verbalized understanding.  

## 2022-10-19 ENCOUNTER — Encounter (HOSPITAL_BASED_OUTPATIENT_CLINIC_OR_DEPARTMENT_OTHER): Payer: Self-pay | Admitting: Orthopaedic Surgery

## 2022-10-19 ENCOUNTER — Encounter (HOSPITAL_BASED_OUTPATIENT_CLINIC_OR_DEPARTMENT_OTHER)
Admission: RE | Disposition: A | Discharge status: Home / Self Care | Payer: Self-pay | Source: Home / Self Care | Attending: Orthopaedic Surgery

## 2022-10-19 ENCOUNTER — Ambulatory Visit (HOSPITAL_BASED_OUTPATIENT_CLINIC_OR_DEPARTMENT_OTHER)
Admission: RE | Admit: 2022-10-19 | Discharge: 2022-10-19 | Disposition: A | Discharge status: Home / Self Care | Payer: Medicaid Other | Attending: Orthopaedic Surgery | Admitting: Orthopaedic Surgery

## 2022-10-19 ENCOUNTER — Other Ambulatory Visit: Payer: Self-pay

## 2022-10-19 ENCOUNTER — Ambulatory Visit (HOSPITAL_COMMUNITY): Payer: Medicaid Other

## 2022-10-19 ENCOUNTER — Other Ambulatory Visit (HOSPITAL_COMMUNITY): Payer: Self-pay

## 2022-10-19 ENCOUNTER — Ambulatory Visit (HOSPITAL_BASED_OUTPATIENT_CLINIC_OR_DEPARTMENT_OTHER): Payer: Medicaid Other | Admitting: Anesthesiology

## 2022-10-19 DIAGNOSIS — S92311A Displaced fracture of first metatarsal bone, right foot, initial encounter for closed fracture: Secondary | ICD-10-CM | POA: Diagnosis not present

## 2022-10-19 DIAGNOSIS — S93431A Sprain of tibiofibular ligament of right ankle, initial encounter: Secondary | ICD-10-CM | POA: Diagnosis not present

## 2022-10-19 DIAGNOSIS — X58XXXA Exposure to other specified factors, initial encounter: Secondary | ICD-10-CM | POA: Insufficient documentation

## 2022-10-19 DIAGNOSIS — S8261XA Displaced fracture of lateral malleolus of right fibula, initial encounter for closed fracture: Secondary | ICD-10-CM | POA: Diagnosis present

## 2022-10-19 DIAGNOSIS — S8251XA Displaced fracture of medial malleolus of right tibia, initial encounter for closed fracture: Secondary | ICD-10-CM | POA: Diagnosis not present

## 2022-10-19 DIAGNOSIS — S92341A Displaced fracture of fourth metatarsal bone, right foot, initial encounter for closed fracture: Secondary | ICD-10-CM | POA: Diagnosis not present

## 2022-10-19 DIAGNOSIS — S92321A Displaced fracture of second metatarsal bone, right foot, initial encounter for closed fracture: Secondary | ICD-10-CM | POA: Insufficient documentation

## 2022-10-19 DIAGNOSIS — S92331A Displaced fracture of third metatarsal bone, right foot, initial encounter for closed fracture: Secondary | ICD-10-CM | POA: Diagnosis not present

## 2022-10-19 DIAGNOSIS — S93314A Dislocation of tarsal joint of right foot, initial encounter: Secondary | ICD-10-CM | POA: Insufficient documentation

## 2022-10-19 DIAGNOSIS — K219 Gastro-esophageal reflux disease without esophagitis: Secondary | ICD-10-CM | POA: Insufficient documentation

## 2022-10-19 HISTORY — PX: OPEN REDUCTION INTERNAL FIXATION (ORIF) FOOT LISFRANC FRACTURE: SHX5990

## 2022-10-19 HISTORY — PX: ORIF ANKLE FRACTURE: SHX5408

## 2022-10-19 HISTORY — DX: Other complications of anesthesia, initial encounter: T88.59XA

## 2022-10-19 HISTORY — PX: SYNDESMOSIS REPAIR: SHX5182

## 2022-10-19 SURGERY — OPEN REDUCTION INTERNAL FIXATION (ORIF) ANKLE FRACTURE
Anesthesia: General | Site: Foot | Laterality: Right

## 2022-10-19 MED ORDER — PROPOFOL 10 MG/ML IV BOLUS
INTRAVENOUS | Status: DC | PRN
  Administered 2022-10-19: 200 mg via INTRAVENOUS

## 2022-10-19 MED ORDER — PROPOFOL 500 MG/50ML IV EMUL
INTRAVENOUS | Status: DC | PRN
  Administered 2022-10-19: 25 ug/kg/min via INTRAVENOUS

## 2022-10-19 MED ORDER — ONDANSETRON HCL 4 MG/2ML IJ SOLN
INTRAMUSCULAR | Status: AC
  Filled 2022-10-19: qty 2

## 2022-10-19 MED ORDER — PHENYLEPHRINE HCL (PRESSORS) 10 MG/ML IV SOLN
INTRAVENOUS | Status: DC | PRN
  Administered 2022-10-19 (×3): 80 ug via INTRAVENOUS

## 2022-10-19 MED ORDER — OXYCODONE HCL 5 MG/5ML PO SOLN: 5.0000 mg | Freq: Once | ORAL | Status: AC | PRN

## 2022-10-19 MED ORDER — DEXAMETHASONE SODIUM PHOSPHATE 10 MG/ML IJ SOLN
INTRAMUSCULAR | Status: DC | PRN
  Administered 2022-10-19: 10 mg via INTRAVENOUS

## 2022-10-19 MED ORDER — FENTANYL CITRATE (PF) 100 MCG/2ML IJ SOLN
INTRAMUSCULAR | Status: DC | PRN
  Administered 2022-10-19 (×2): 50 ug via INTRAVENOUS

## 2022-10-19 MED ORDER — LACTATED RINGERS IV SOLN: INTRAVENOUS | Status: DC

## 2022-10-19 MED ORDER — LIDOCAINE HCL (CARDIAC) PF 100 MG/5ML IV SOSY
PREFILLED_SYRINGE | INTRAVENOUS | Status: DC | PRN
  Administered 2022-10-19: 100 mg via INTRATRACHEAL

## 2022-10-19 MED ORDER — CEFAZOLIN SODIUM-DEXTROSE 2-4 GM/100ML-% IV SOLN
2.0000 g | INTRAVENOUS | Status: AC
  Administered 2022-10-19: 2 g via INTRAVENOUS

## 2022-10-19 MED ORDER — VANCOMYCIN HCL 500 MG IV SOLR
INTRAVENOUS | Status: AC
  Filled 2022-10-19: qty 10

## 2022-10-19 MED ORDER — VANCOMYCIN HCL 500 MG IV SOLR
INTRAVENOUS | Status: DC | PRN
  Administered 2022-10-19: 500 mg via TOPICAL

## 2022-10-19 MED ORDER — DEXMEDETOMIDINE HCL IN NACL 80 MCG/20ML IV SOLN
INTRAVENOUS | Status: DC | PRN
  Administered 2022-10-19: 12 ug via INTRAVENOUS

## 2022-10-19 MED ORDER — FENTANYL CITRATE (PF) 100 MCG/2ML IJ SOLN
100.0000 ug | Freq: Once | INTRAMUSCULAR | Status: AC
  Administered 2022-10-19: 100 ug via INTRAVENOUS

## 2022-10-19 MED ORDER — PROMETHAZINE HCL 25 MG/ML IJ SOLN: 6.2500 mg | INTRAMUSCULAR | Status: DC | PRN

## 2022-10-19 MED ORDER — ONDANSETRON HCL 4 MG/2ML IJ SOLN
INTRAMUSCULAR | Status: DC | PRN
  Administered 2022-10-19: 4 mg via INTRAVENOUS

## 2022-10-19 MED ORDER — FENTANYL CITRATE (PF) 100 MCG/2ML IJ SOLN: 25.0000 ug | INTRAMUSCULAR | Status: DC | PRN

## 2022-10-19 MED ORDER — 0.9 % SODIUM CHLORIDE (POUR BTL) OPTIME
TOPICAL | Status: DC | PRN
  Administered 2022-10-19: 1000 mL

## 2022-10-19 MED ORDER — FENTANYL CITRATE (PF) 100 MCG/2ML IJ SOLN
INTRAMUSCULAR | Status: AC
  Filled 2022-10-19: qty 2

## 2022-10-19 MED ORDER — DEXAMETHASONE SODIUM PHOSPHATE 10 MG/ML IJ SOLN
INTRAMUSCULAR | Status: AC
  Filled 2022-10-19: qty 1

## 2022-10-19 MED ORDER — MIDAZOLAM HCL 2 MG/2ML IJ SOLN
2.0000 mg | Freq: Once | INTRAMUSCULAR | Status: AC
  Administered 2022-10-19: 2 mg via INTRAVENOUS

## 2022-10-19 MED ORDER — ACETAMINOPHEN 500 MG PO TABS
ORAL_TABLET | ORAL | Status: AC
  Filled 2022-10-19: qty 2

## 2022-10-19 MED ORDER — ACETAMINOPHEN 500 MG PO TABS
1000.0000 mg | ORAL_TABLET | Freq: Once | ORAL | Status: AC
  Administered 2022-10-19: 1000 mg via ORAL

## 2022-10-19 MED ORDER — MIDAZOLAM HCL 2 MG/2ML IJ SOLN
INTRAMUSCULAR | Status: AC
  Filled 2022-10-19: qty 2

## 2022-10-19 MED ORDER — CEFAZOLIN SODIUM-DEXTROSE 2-4 GM/100ML-% IV SOLN
INTRAVENOUS | Status: AC
  Filled 2022-10-19: qty 100

## 2022-10-19 MED ORDER — ROPIVACAINE HCL 5 MG/ML IJ SOLN
INTRAMUSCULAR | Status: DC | PRN
  Administered 2022-10-19: 15 mL via PERINEURAL
  Administered 2022-10-19: 30 mL via PERINEURAL

## 2022-10-19 MED ORDER — LIDOCAINE 2% (20 MG/ML) 5 ML SYRINGE
INTRAMUSCULAR | Status: AC
  Filled 2022-10-19: qty 5

## 2022-10-19 MED ORDER — OXYCODONE HCL 5 MG PO TABS
ORAL_TABLET | ORAL | Status: AC
  Filled 2022-10-19: qty 1

## 2022-10-19 MED ORDER — OXYCODONE HCL 5 MG PO TABS
5.0000 mg | ORAL_TABLET | Freq: Once | ORAL | Status: AC | PRN
  Administered 2022-10-19: 5 mg via ORAL

## 2022-10-19 MED ORDER — PROPOFOL 10 MG/ML IV BOLUS
INTRAVENOUS | Status: AC
  Filled 2022-10-19: qty 20

## 2022-10-19 SURGICAL SUPPLY — 92 items
APL PRP STRL LF DISP 70% ISPRP (MISCELLANEOUS) ×4
BANDAGE ESMARK 6X9 LF (GAUZE/BANDAGES/DRESSINGS) ×3 IMPLANT
BIT DRILL 2.5X2.75 QC CALB (BIT) IMPLANT
BIT DRILL 2.9 CANN QC NONSTRL (BIT) IMPLANT
BLADE SURG 15 STRL LF DISP TIS (BLADE) ×12 IMPLANT
BLADE SURG 15 STRL SS (BLADE) ×12
BNDG CMPR 5X4 CHSV STRCH STRL (GAUZE/BANDAGES/DRESSINGS) ×2
BNDG CMPR 5X4 KNIT ELC UNQ LF (GAUZE/BANDAGES/DRESSINGS)
BNDG CMPR 5X62 HK CLSR LF (GAUZE/BANDAGES/DRESSINGS) ×2
BNDG CMPR 6 X 5 YARDS HK CLSR (GAUZE/BANDAGES/DRESSINGS) ×2
BNDG CMPR 6"X 5 YARDS HK CLSR (GAUZE/BANDAGES/DRESSINGS) ×2
BNDG CMPR 9X6 STRL LF SNTH (GAUZE/BANDAGES/DRESSINGS) ×2
BNDG COHESIVE 4X5 TAN STRL LF (GAUZE/BANDAGES/DRESSINGS) ×3 IMPLANT
BNDG ELASTIC 4INX 5YD STR LF (GAUZE/BANDAGES/DRESSINGS) ×6 IMPLANT
BNDG ELASTIC 6INX 5YD STR LF (GAUZE/BANDAGES/DRESSINGS) ×3 IMPLANT
BNDG ESMARK 6X9 LF (GAUZE/BANDAGES/DRESSINGS) ×2
BNDG GAUZE DERMACEA FLUFF 4 (GAUZE/BANDAGES/DRESSINGS) ×3 IMPLANT
BNDG GZE DERMACEA 4 6PLY (GAUZE/BANDAGES/DRESSINGS) ×2
BRUSH SCRUB EZ 4% CHG (MISCELLANEOUS) ×3 IMPLANT
CANISTER SUCT 1200ML W/VALVE (MISCELLANEOUS) ×3 IMPLANT
CHLORAPREP W/TINT 26 (MISCELLANEOUS) ×6 IMPLANT
COVER BACK TABLE 60X90IN (DRAPES) ×3 IMPLANT
CUFF TOURN SGL QUICK 34 (TOURNIQUET CUFF) ×2
CUFF TRNQT CYL 34X4.125X (TOURNIQUET CUFF) ×3 IMPLANT
DRAPE C-ARM 42X72 X-RAY (DRAPES) ×3 IMPLANT
DRAPE C-ARMOR (DRAPES) ×3 IMPLANT
DRAPE EXTREMITY T 121X128X90 (DISPOSABLE) ×3 IMPLANT
DRAPE IMP U-DRAPE 54X76 (DRAPES) ×3 IMPLANT
DRAPE U-SHAPE 47X51 STRL (DRAPES) ×3 IMPLANT
DRSG MEPITEL 4X7.2 (GAUZE/BANDAGES/DRESSINGS) ×3 IMPLANT
ELECT REM PT RETURN 9FT ADLT (ELECTROSURGICAL) ×2
ELECTRODE REM PT RTRN 9FT ADLT (ELECTROSURGICAL) ×3 IMPLANT
FIXATION ZIPTIGHT ANKLE SNDSMS (Ankle) IMPLANT
GAUZE PAD ABD 8X10 STRL (GAUZE/BANDAGES/DRESSINGS) ×15 IMPLANT
GAUZE SPONGE 4X4 12PLY STRL (GAUZE/BANDAGES/DRESSINGS) ×3 IMPLANT
GLOVE BIOGEL PI IND STRL 7.0 (GLOVE) IMPLANT
GLOVE BIOGEL PI IND STRL 7.5 (GLOVE) IMPLANT
GLOVE BIOGEL PI IND STRL 8 (GLOVE) ×3 IMPLANT
GLOVE SURG SS PI 7.5 STRL IVOR (GLOVE) ×6 IMPLANT
GLOVE SURG SYN 7.5 E (GLOVE) ×2 IMPLANT
GLOVE SURG SYN 7.5 PF PI (GLOVE) IMPLANT
GLOVE SURG SYN 8.0 (GLOVE) ×8 IMPLANT
GLOVE SURG SYN 8.0 PF PI (GLOVE) IMPLANT
GOWN STRL REUS W/ TWL LRG LVL3 (GOWN DISPOSABLE) ×6 IMPLANT
GOWN STRL REUS W/ TWL XL LVL3 (GOWN DISPOSABLE) IMPLANT
GOWN STRL REUS W/TWL LRG LVL3 (GOWN DISPOSABLE) ×4
GOWN STRL REUS W/TWL XL LVL3 (GOWN DISPOSABLE) ×4
K-WIRE ACE 1.6X6 (WIRE) ×12
KWIRE ACE 1.6X6 (WIRE) IMPLANT
MARKER SKIN DUAL TIP RULER LAB (MISCELLANEOUS) IMPLANT
NDL HYPO 25X1 1.5 SAFETY (NEEDLE) IMPLANT
NEEDLE HYPO 25X1 1.5 SAFETY (NEEDLE) IMPLANT
NS IRRIG 1000ML POUR BTL (IV SOLUTION) ×3 IMPLANT
PACK BASIN DAY SURGERY FS (CUSTOM PROCEDURE TRAY) ×3 IMPLANT
PAD CAST 4YDX4 CTTN HI CHSV (CAST SUPPLIES) ×3 IMPLANT
PADDING CAST ABS COTTON 4X4 ST (CAST SUPPLIES) IMPLANT
PADDING CAST COTTON 4X4 STRL (CAST SUPPLIES) ×2
PADDING CAST SYNTHETIC 4X4 STR (CAST SUPPLIES) ×6 IMPLANT
PADDING CAST SYNTHETIC 6X4 NS (CAST SUPPLIES) ×6 IMPLANT
PENCIL SMOKE EVACUATOR (MISCELLANEOUS) ×3 IMPLANT
PLATE LOCK 7H 92 BILAT FIB (Plate) IMPLANT
SCREW CANC FT 4X34 (Screw) IMPLANT
SCREW LAG CANC FT 4X44 (Screw) IMPLANT
SCREW LOCK 3.5X18 DIST TIB (Screw) IMPLANT
SCREW LOCK CORT STAR 3.5X14 (Screw) IMPLANT
SCREW LOCK CORT STAR 3.5X16 (Screw) IMPLANT
SCREW LOW PROFILE 18MMX3.5MM (Screw) IMPLANT
SCREW NLOCK CANC HEX 4X36 (Screw) IMPLANT
SHEET MEDIUM DRAPE 40X70 STRL (DRAPES) ×3 IMPLANT
SLEEVE SCD COMPRESS KNEE MED (STOCKING) ×3 IMPLANT
SPIKE FLUID TRANSFER (MISCELLANEOUS) IMPLANT
SPLINT PLASTER CAST FAST 5X30 (CAST SUPPLIES) ×60 IMPLANT
SPONGE T-LAP 18X18 ~~LOC~~+RFID (SPONGE) ×3 IMPLANT
STAPLER VISISTAT 35W (STAPLE) IMPLANT
STOCKINETTE 6 STRL (DRAPES) ×3 IMPLANT
STOCKINETTE ORTHO 6X25 (MISCELLANEOUS) ×3 IMPLANT
SUCTION TUBE FRAZIER 10FR DISP (SUCTIONS) IMPLANT
SUT ETHILON 2 0 FS 18 (SUTURE) ×6 IMPLANT
SUT MNCRL AB 3-0 PS2 18 (SUTURE) ×3 IMPLANT
SUT PDS 3-0 CT2 (SUTURE)
SUT PDS II 3-0 CT2 27 ABS (SUTURE) IMPLANT
SUT VIC AB 2-0 SH 27 (SUTURE) ×2
SUT VIC AB 2-0 SH 27XBRD (SUTURE) IMPLANT
SUT VIC AB 3-0 SH 27 (SUTURE)
SUT VIC AB 3-0 SH 27X BRD (SUTURE) IMPLANT
SUT VICRYL 0 SH 27 (SUTURE) IMPLANT
SYR BULB IRRIG 60ML STRL (SYRINGE) ×3 IMPLANT
SYR CONTROL 10ML LL (SYRINGE) IMPLANT
TOWEL GREEN STERILE FF (TOWEL DISPOSABLE) ×6 IMPLANT
TUBE CONNECTING 20X1/4 (TUBING) ×3 IMPLANT
UNDERPAD 30X36 HEAVY ABSORB (UNDERPADS AND DIAPERS) ×3 IMPLANT
ZIPTIGHT ANKLE SYNODESMOSS FIX (Ankle) ×2 IMPLANT

## 2022-10-19 NOTE — Anesthesia Procedure Notes (Signed)
Procedure Name: LMA Insertion Date/Time: 10/19/2022 12:48 PM  Performed by: Thornell Mule, CRNAPre-anesthesia Checklist: Patient identified, Emergency Drugs available, Suction available and Patient being monitored Patient Re-evaluated:Patient Re-evaluated prior to induction Oxygen Delivery Method: Circle system utilized Preoxygenation: Pre-oxygenation with 100% oxygen Induction Type: IV induction LMA: LMA inserted LMA Size: 4.0 Number of attempts: 1 Placement Confirmation: positive ETCO2 Tube secured with: Tape Dental Injury: Teeth and Oropharynx as per pre-operative assessment

## 2022-10-19 NOTE — Anesthesia Procedure Notes (Signed)
Anesthesia Regional Block: Popliteal block   Pre-Anesthetic Checklist: , timeout performed,  Correct Patient, Correct Site, Correct Laterality,  Correct Procedure, Correct Position, site marked,  Risks and benefits discussed,  Surgical consent,  Pre-op evaluation,  At surgeon's request and post-op pain management  Laterality: Right  Prep: chloraprep       Needles:  Injection technique: Single-shot  Needle Type: Echogenic Stimulator Needle     Needle Length: 9cm  Needle Gauge: 21     Additional Needles:   Procedures:,,,, ultrasound used (permanent image in chart),,    Narrative:  Start time: 10/19/2022 10:53 AM End time: 10/19/2022 10:57 AM Injection made incrementally with aspirations every 5 mL.  Performed by: Personally  Anesthesiologist: Linton Rump, MD  Additional Notes: Discussed risks and benefits of nerve block including, but not limited to, prolonged and/or permanent nerve injury involving sensory and/or motor function. Monitors were applied and a time-out was performed. The nerve and associated structures were visualized under ultrasound guidance. After negative aspiration, local anesthetic was slowly injected around the nerve. There was no evidence of high pressure during the procedure. There were no paresthesias. VSS remained stable and the patient tolerated the procedure well.

## 2022-10-19 NOTE — Op Note (Signed)
10/19/2022  5:25 PM   PATIENT: Wesley Schmidt  27 y.o. male  MRN: [id number]   PRE-OPERATIVE DIAGNOSIS:   Closed fracture of right lateral malleolus and midfoot Lisfranc fracture dislocation   POST-OPERATIVE DIAGNOSIS:   Same   PROCEDURE: 1] Right distal fibula ORIF 2] Right ankle syndesmosis ORIF 3] Right Lisfranc fracture complex ORIF 4] Right foot intercuneiform joint ORIF 5] Right foot 1st met-2nd met ORIF with open reduction of right 1st TMT joint 6] Right 3rd metatarsal fracture closed reduction and splinting 7] Right 4th metatarsal fracture closed reduction and splinting   SURGEON:  Netta Cedars, MD   ASSISTANT: None   ANESTHESIA: General, regional   EBL: Minimal   TOURNIQUET:    Total Tourniquet Time Documented: Thigh (Right) - 69 minutes Total: Thigh (Right) - 69 minutes    COMPLICATIONS: None apparent   DISPOSITION: Extubated, awake and stable to recovery.   INDICATION FOR PROCEDURE: The patient presented with above diagnosis.  We discussed the diagnosis, alternative treatment options, risks and benefits of the above surgical intervention, as well as alternative non-operative treatments. All questions/concerns were addressed and the patient/family demonstrated appropriate understanding of the diagnosis, the procedure, the postoperative course, and overall prognosis. The patient wished to proceed with surgical intervention and signed an informed surgical consent as such, in each others presence prior to surgery.   PROCEDURE IN DETAIL: After preoperative consent was obtained and the correct operative site was identified, the patient was brought to the operating room supine on stretcher and transferred onto operating table. General anesthesia was induced. Preoperative antibiotics were administered. Surgical timeout was taken. The patient was then positioned supine with an ipsilateral hip bump. The operative lower extremity was prepped and draped  in standard sterile fashion with a tourniquet around the thigh. The extremity was exsanguinated and the tourniquet was inflated to 275 mmHg.  A standard lateral incision was made over the distal fibula. Dissection was carried down to the level of the fibula and the fracture site identified. The superficial peroneal nerve was identified and protected throughout the procedure. The fibula was noted to be shortened with interposed periosteum. The fibula was brought out to length. The fibula fracture was debrided and the edges defined to achieve cortical read. Reduction maneuver was performed using pointed reduction forceps and lobster forceps. In this manner, the fibula length was restored and fracture reduced. A lag screw was not placed given the orientation of fracture lines and comminution. Due to poor bone quality and extensive comminution at the fracture site, it was decided to use a locking distal fibula plate. We then selected a Zimmer locking plate to match the anatomy of the distal fibula and placed it laterally. This was implanted under intraoperative fluoroscopy with a combination of distal locking screws and proximal cortical & locking screws.  A manual external rotation stress radiograph was obtained and demonstrated widening of the ankle mortise. Given this intraoperative finding as well as preoperative subluxation, it was decided to reduce and fix the syndesmosis. Therefore a suture fixation system (ZipTight device) was implanted through the fibula plate in cannulated fashion to fix the syndesmosis. Anchor/button position was verified along anteromedial tibial cortex by fluoroscopy. A repeat stress radiograph showed complete stability of the ankle mortise to testing.  Abduction stress testing of the foot demonstrated instability at the Lisfranc complex. A dorsal incision was made to debride the area of the Lisfranc ligament and to place pointed reduction clamp. The midfoot was reduced and this was  provisionally pinned  with Kirschner wire in the orientation of the native Lisfranc ligament. The intercuneiform joint between the medial and intermedial cuneiforms was also pinned with Kirschner wire.    Medial incisions were made over these wires and dissection carried down to the level of bone. The wires were overdrilled sequentially with a cannulated drill and the wires were removed while maintaining midfoot reduction. Two solid 4.0 fully threaded headed screws were implanted and stability was noted to clinical and fluoroscopic testing.   We then performed open reduction of the 1st TMT joint with joint congruency and stability noted. A third solid 4.0 screw was implanted from 1st metatarsal to 2nd metatarsal shaft to restore normal intermetatarsal angle and stabilize 1st TMT.  Closed reduction of the third and fourth metatarsal bones was performed under fluoroscopy.  The surgical sites were thoroughly irrigated. The tourniquet was deflated and hemostasis achieved. Betadine and vancomycin powder were applied. The deep layers were closed using 2-0 vicryl. The skin was closed without tension using 2-0 nylon suture and staples.    The leg was cleaned with saline and sterile dressings with gauze were applied. A well padded bulky short leg splint was applied. The patient was awakened from anesthesia and transported to the recovery room in stable condition.    FOLLOW UP PLAN: -transfer to PACU, then home -strict NWB operative extremity, maximum elevation -maintain short leg splint until follow up -DVT ppx: Aspirin 81 mg twice daily while NWB -follow up as outpatient within 7-10 days for wound check with exchange of short leg splint to short leg cast -sutures out in 2-3 weeks in outpatient office   RADIOGRAPHS: 1] Right Ankle AP, lateral, oblique and stress radiographs of the right ankle were obtained intraoperatively. These showed interval reduction and fixation of the fractures. Manual stress  radiographs were taken and the ankle mortise and tibiofibular relationship were noted to be stable following fixation. All hardware is appropriately positioned and of the appropriate lengths. No other acute injuries are noted.  2] Right Foot AP, lateral, oblique and stress radiographs of the right foot were obtained intraoperatively. These showed interval reduction and fixation of the fractures. Manual stress radiographs were taken and the midfoot joints were noted to be stable following fixation. All hardware is appropriately positioned and of the appropriate lengths. No other acute injuries are noted.   Netta Cedars Orthopaedic Surgery [facility]

## 2022-10-19 NOTE — Anesthesia Procedure Notes (Signed)
Anesthesia Regional Block: Adductor canal block   Pre-Anesthetic Checklist: , timeout performed,  Correct Patient, Correct Site, Correct Laterality,  Correct Procedure, Correct Position, site marked,  Risks and benefits discussed,  Surgical consent,  Pre-op evaluation,  At surgeon's request and post-op pain management  Laterality: Right  Prep: chloraprep       Needles:  Injection technique: Single-shot  Needle Type: Echogenic Stimulator Needle     Needle Length: 9cm  Needle Gauge: 21     Additional Needles:   Procedures:,,,, ultrasound used (permanent image in chart),,    Narrative:  Start time: 10/19/2022 10:58 AM End time: 10/19/2022 11:00 AM Injection made incrementally with aspirations every 5 mL.  Performed by: Personally  Anesthesiologist: Linton Rump, MD  Additional Notes: Discussed risks and benefits of nerve block including, but not limited to, prolonged and/or permanent nerve injury involving sensory and/or motor function. Monitors were applied and a time-out was performed. The nerve and associated structures were visualized under ultrasound guidance. After negative aspiration, local anesthetic was slowly injected around the nerve. There was no evidence of high pressure during the procedure. There were no paresthesias. VSS remained stable and the patient tolerated the procedure well.

## 2022-10-19 NOTE — H&P (Signed)
H&P Update:  -History and Physical Reviewed  -Patient has been re-examined  -No change in the plan of care  -The risks and benefits were presented and reviewed. The risks due to hardware/suture failure and/or irritation, new/persistent infection, stiffness, nerve/vessel/tendon injury or rerupture of repaired tendon, nonunion/malunion, allograft usage, wound healing issues, development of arthritis, failure of this surgery, possibility of external fixation with delayed definitive surgery, need for further surgery, thromboembolic events, anesthesia/medical complications, amputation, death among others were discussed. We discussed the need for eventual elective midfoot screw removal. The patient acknowledged the explanation, agreed to proceed with the plan and a consent was signed.  Wesley Schmidt

## 2022-10-19 NOTE — Progress Notes (Signed)
Assisted Dr. Jennifer Allan with right, adductor canal, popliteal, ultrasound guided block. Side rails up, monitors on throughout procedure. See vital signs in flow sheet. Tolerated Procedure well. 

## 2022-10-19 NOTE — Anesthesia Postprocedure Evaluation (Signed)
Anesthesia Post Note  Patient: Wesley Schmidt  Procedure(s) Performed: Open Reduction Internal Fixation (ORIF) Bimalleolar Ankle Fracture (Right: Ankle) Syndesmosis (Right: Ankle) Open Reduction Internal Fixation (ORIF) Foot Lisfranc Fracture (Right: Foot)     Patient location during evaluation: PACU Anesthesia Type: General and Regional Level of consciousness: awake Pain management: pain level controlled Vital Signs Assessment: post-procedure vital signs reviewed and stable Respiratory status: spontaneous breathing, nonlabored ventilation and respiratory function stable Cardiovascular status: blood pressure returned to baseline and stable Postop Assessment: no apparent nausea or vomiting Anesthetic complications: no   No notable events documented.  Last Vitals:  Vitals:   10/19/22 1445 10/19/22 1455  BP: 118/75 123/67  Pulse: 81 84  Resp: 15 20  Temp:    SpO2: 100% 98%    Last Pain:  Vitals:   10/19/22 1445  PainSc: 0-No pain                 Linton Rump

## 2022-10-19 NOTE — Anesthesia Preprocedure Evaluation (Addendum)
Anesthesia Evaluation  Patient identified by MRN, date of birth, ID band Patient awake    Reviewed: Allergy & Precautions, NPO status , Patient's Chart, lab work & pertinent test results  History of Anesthesia Complications (+) history of anesthetic complications (woke up once during a hand surgery)  Airway Mallampati: III  TM Distance: >3 FB Neck ROM: Full    Dental  (+) Dental Advisory Given   Pulmonary neg pulmonary ROS   Pulmonary exam normal breath sounds clear to auscultation       Cardiovascular negative cardio ROS  Rhythm:Regular Rate:Normal     Neuro/Psych negative neurological ROS     GI/Hepatic Neg liver ROS,GERD  Medicated,,  Endo/Other  negative endocrine ROS    Renal/GU negative Renal ROS     Musculoskeletal   Abdominal   Peds  Hematology negative hematology ROS (+)   Anesthesia Other Findings   Reproductive/Obstetrics                             Anesthesia Physical Anesthesia Plan  ASA: 2  Anesthesia Plan: General   Post-op Pain Management: Regional block* and Tylenol PO (pre-op)*   Induction: Intravenous  PONV Risk Score and Plan: 2 and Ondansetron, Dexamethasone and Treatment may vary due to age or medical condition  Airway Management Planned: LMA  Additional Equipment:   Intra-op Plan:   Post-operative Plan: Extubation in OR  Informed Consent: I have reviewed the patients History and Physical, chart, labs and discussed the procedure including the risks, benefits and alternatives for the proposed anesthesia with the patient or authorized representative who has indicated his/her understanding and acceptance.     Dental advisory given  Plan Discussed with: CRNA and Anesthesiologist  Anesthesia Plan Comments: (Discussed potential risks of nerve blocks including, but not limited to, infection, bleeding, nerve damage, seizures, pneumothorax, respiratory  depression, and potential failure of the block. Alternatives to nerve blocks discussed. All questions answered.  Risks of general anesthesia discussed including, but not limited to, sore throat, hoarse voice, chipped/damaged teeth, injury to vocal cords, nausea and vomiting, allergic reactions, lung infection, heart attack, stroke, and death. All questions answered. )        Anesthesia Quick Evaluation

## 2022-10-19 NOTE — Transfer of Care (Signed)
Immediate Anesthesia Transfer of Care Note  Patient: Wesley Schmidt  Procedure(s) Performed: Open Reduction Internal Fixation (ORIF) Lateral Malleolus Ankle Fracture with Syndesmosis (Right: Ankle) Open Reduction Internal Fixation (ORIF) Foot Lisfranc Fracture (Right: Foot)  Patient Location: PACU  Anesthesia Type:GA combined with regional for post-op pain  Level of Consciousness: drowsy, patient cooperative, and responds to stimulation  Airway & Oxygen Therapy: Patient Spontanous Breathing and Patient connected to face mask oxygen  Post-op Assessment: Report given to RN and Post -op Vital signs reviewed and stable  Post vital signs: Reviewed and stable  Last Vitals:  Vitals Value Taken Time  BP 123/74 10/19/22 1431  Temp    Pulse 83 10/19/22 1431  Resp 15 10/19/22 1431  SpO2 100 % 10/19/22 1431  Vitals shown include unvalidated device data.  Last Pain:  Vitals:   10/19/22 1023  PainSc: 0-No pain      Patients Stated Pain Goal: 4 (10/19/22 1023)  Complications: No notable events documented.

## 2022-10-21 ENCOUNTER — Encounter (HOSPITAL_BASED_OUTPATIENT_CLINIC_OR_DEPARTMENT_OTHER): Payer: Self-pay | Admitting: Orthopaedic Surgery

## 2022-11-10 ENCOUNTER — Ambulatory Visit: Payer: Medicaid Other | Attending: Critical Care Medicine | Admitting: Critical Care Medicine

## 2022-11-10 ENCOUNTER — Encounter: Payer: Self-pay | Admitting: Critical Care Medicine

## 2022-11-10 ENCOUNTER — Other Ambulatory Visit (HOSPITAL_COMMUNITY): Payer: Self-pay

## 2022-11-10 VITALS — BP 111/72 | HR 93

## 2022-11-10 DIAGNOSIS — I82491 Acute embolism and thrombosis of other specified deep vein of right lower extremity: Secondary | ICD-10-CM

## 2022-11-10 DIAGNOSIS — S20211S Contusion of right front wall of thorax, sequela: Secondary | ICD-10-CM | POA: Diagnosis not present

## 2022-11-10 DIAGNOSIS — G5771 Causalgia of right lower limb: Secondary | ICD-10-CM

## 2022-11-10 DIAGNOSIS — S82831D Other fracture of upper and lower end of right fibula, subsequent encounter for closed fracture with routine healing: Secondary | ICD-10-CM

## 2022-11-10 DIAGNOSIS — S93324A Dislocation of tarsometatarsal joint of right foot, initial encounter: Secondary | ICD-10-CM

## 2022-11-10 DIAGNOSIS — I82409 Acute embolism and thrombosis of unspecified deep veins of unspecified lower extremity: Secondary | ICD-10-CM | POA: Insufficient documentation

## 2022-11-10 MED ORDER — GABAPENTIN 300 MG PO CAPS
300.0000 mg | ORAL_CAPSULE | Freq: Three times a day (TID) | ORAL | 3 refills | Status: AC
  Filled 2022-11-10: qty 90, 30d supply, fill #0
  Filled 2023-04-24: qty 90, 30d supply, fill #1
  Filled 2023-06-05: qty 90, 30d supply, fill #2

## 2022-11-10 MED ORDER — GABAPENTIN 300 MG PO CAPS
300.0000 mg | ORAL_CAPSULE | Freq: Three times a day (TID) | ORAL | 3 refills | Status: DC
  Filled 2022-11-10: qty 90, 30d supply, fill #0

## 2022-11-10 NOTE — Patient Instructions (Signed)
Take gabapentin one three times a day for pain Take extra strength tylenol two 4 times a day for pain Take Ibuprofen 400mg  4 times a day as needed for pain Labs today Get back on aspirin twice a day 81mg  Ultrasound of leg will be made Return dR Alphonzo Devera 3 weeks
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
## 2022-11-10 NOTE — Assessment & Plan Note (Signed)
Status post surgical intervention.

## 2022-11-10 NOTE — Assessment & Plan Note (Signed)
Management per orthopedics.

## 2022-11-10 NOTE — Assessment & Plan Note (Signed)
Patient's not been taking DVT prophylaxis concerned about the possibility of DVT based on the exam and history will obtain venous Doppler ultrasound

## 2022-11-10 NOTE — Assessment & Plan Note (Signed)
Resolved

## 2022-11-10 NOTE — Assessment & Plan Note (Signed)
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

## 2022-11-11 LAB — CMP14+EGFR: CO2: 21 mmol/L (ref 20–29)

## 2022-11-11 LAB — CBC WITH DIFFERENTIAL/PLATELET
Lymphocytes Absolute: 1.3 10*3/uL (ref 0.7–3.1)
Neutrophils Absolute: 4.6 10*3/uL (ref 1.4–7.0)

## 2022-11-14 NOTE — Progress Notes (Signed)
Let pt know all labs normal  no sign of blood clot  call and CANCEL is ultrasound of right leg

## 2022-11-15 ENCOUNTER — Telehealth: Payer: Self-pay

## 2022-11-15 NOTE — Telephone Encounter (Signed)
Pt was called and vm was left, Information has been sent to nurse pool.   

## 2022-11-15 NOTE — Telephone Encounter (Signed)
-----   Message from Shan Levans sent at 11/14/2022 11:09 AM EDT ----- Let pt know all labs normal  no sign of blood clot  call and CANCEL is ultrasound of right leg

## 2022-11-24 ENCOUNTER — Other Ambulatory Visit (HOSPITAL_COMMUNITY): Payer: Self-pay

## 2023-02-02 ENCOUNTER — Other Ambulatory Visit (HOSPITAL_COMMUNITY): Payer: Self-pay

## 2023-02-02 MED ORDER — MELOXICAM 7.5 MG PO TABS
7.5000 mg | ORAL_TABLET | Freq: Every day | ORAL | 0 refills | Status: AC | PRN
  Filled 2023-02-02: qty 30, 30d supply, fill #0

## 2023-02-03 ENCOUNTER — Other Ambulatory Visit (HOSPITAL_COMMUNITY): Payer: Self-pay

## 2023-02-19 ENCOUNTER — Other Ambulatory Visit (HOSPITAL_COMMUNITY): Payer: Self-pay

## 2023-02-19 MED ORDER — PROMETHAZINE-DM 6.25-15 MG/5ML PO SYRP
5.0000 mL | ORAL_SOLUTION | ORAL | 0 refills | Status: AC
  Filled 2023-02-19: qty 300, 10d supply, fill #0

## 2023-02-19 MED ORDER — METHYLPREDNISOLONE 4 MG PO TBPK
ORAL_TABLET | ORAL | 0 refills | Status: AC
  Filled 2023-02-19: qty 21, 6d supply, fill #0

## 2023-02-20 ENCOUNTER — Other Ambulatory Visit (HOSPITAL_COMMUNITY): Payer: Self-pay

## 2023-02-20 ENCOUNTER — Other Ambulatory Visit: Payer: Self-pay

## 2023-04-24 ENCOUNTER — Other Ambulatory Visit (HOSPITAL_COMMUNITY): Payer: Self-pay

## 2023-05-08 ENCOUNTER — Other Ambulatory Visit: Payer: Self-pay

## 2023-05-08 ENCOUNTER — Encounter (HOSPITAL_BASED_OUTPATIENT_CLINIC_OR_DEPARTMENT_OTHER): Payer: Self-pay | Admitting: Orthopaedic Surgery

## 2023-05-14 NOTE — Discharge Instructions (Signed)
Netta Cedars, MD [facility]  Please read the following information regarding your care after surgery.  Medications  You only need a prescription for the narcotic pain medicine (ex. oxycodone, Percocet, Norco).  All of the other medicines listed below are available over the counter. ? Aleve 2 pills twice a day for the first 3 days after surgery. ? acetominophen (Tylenol) 650 mg every 4-6 hours as you need for minor to moderate pain ? oxycodone as prescribed for severe pain  ? To help prevent blood clots, take aspirin (81 mg) twice daily for 28 days after surgery.  You should also get up every hour while you are awake to move around.  Weight Bearing ? OK to walk on the operative leg only AFTER the nerve block has completely worn off.  Cast / Splint / Dressing ? Keep your dressing clean and dry.  Don't put anything (coat hanger, pencil, etc) down inside of it.  If it gets wet, please notify the office immediately.  Swelling IMPORTANT: It is normal for you to have swelling where you had surgery. To reduce swelling and pain, keep at least 3 pillows under your leg so that your toes are above your nose and your heel is above the level of your hip.  It may be necessary to keep your foot or leg elevated for several weeks.  This is critical to helping your incisions heal and your pain to feel better.  Follow Up Call my office at [phone] when you are discharged from the hospital or surgery center to schedule an appointment to be seen within 7-10 days after surgery.  Call my office at [phone] if you develop a fever >101.5 F, nausea, vomiting, bleeding from the surgical site or severe pain.   Post Anesthesia Home Care Instructions  Activity: Get plenty of rest for the remainder of the day. A responsible individual must stay with you for 24 hours following the procedure.  For the next 24 hours, DO NOT: -Drive a car -Advertising copywriter -Drink alcoholic beverages -Take any  medication unless instructed by your physician -Make any legal decisions or sign important papers.  Meals: Start with liquid foods such as gelatin or soup. Progress to regular foods as tolerated. Avoid greasy, spicy, heavy foods. If nausea and/or vomiting occur, drink only clear liquids until the nausea and/or vomiting subsides. Call your physician if vomiting continues.  Special Instructions/Symptoms: Your throat may feel dry or sore from the anesthesia or the breathing tube placed in your throat during surgery. If this causes discomfort, gargle with warm salt water. The discomfort should disappear within 24 hours.  If you had a scopolamine patch placed behind your ear for the management of post- operative nausea and/or vomiting:  1. The medication in the patch is effective for 72 hours, after which it should be removed.  Wrap patch in a tissue and discard in the trash. Wash hands thoroughly with soap and water. 2. You may remove the patch earlier than 72 hours if you experience unpleasant side effects which may include dry mouth, dizziness or visual disturbances. 3. Avoid touching the patch. Wash your hands with soap and water after contact with the patch.    Post Anesthesia Home Care Instructions  Activity: Get plenty of rest for the remainder of the day. A responsible individual must stay with you for 24 hours following the procedure.  For the next 24 hours, DO NOT: -Drive a car -Advertising copywriter -Drink alcoholic beverages -Take any medication unless instructed by your  physician -Make any legal decisions or sign important papers.  Meals: Start with liquid foods such as gelatin or soup. Progress to regular foods as tolerated. Avoid greasy, spicy, heavy foods. If nausea and/or vomiting occur, drink only clear liquids until the nausea and/or vomiting subsides. Call your physician if vomiting continues.  Special Instructions/Symptoms: Your throat may feel dry or sore from the  anesthesia or the breathing tube placed in your throat during surgery. If this causes discomfort, gargle with warm salt water. The discomfort should disappear within 24 hours.  If you had a scopolamine patch placed behind your ear for the management of post- operative nausea and/or vomiting:  1. The medication in the patch is effective for 72 hours, after which it should be removed.  Wrap patch in a tissue and discard in the trash. Wash hands thoroughly with soap and water. 2. You may remove the patch earlier than 72 hours if you experience unpleasant side effects which may include dry mouth, dizziness or visual disturbances. 3. Avoid touching the patch. Wash your hands with soap and water after contact with the patch.    Post Anesthesia Home Care Instructions  Activity: Get plenty of rest for the remainder of the day. A responsible individual must stay with you for 24 hours following the procedure.  For the next 24 hours, DO NOT: -Drive a car -Advertising copywriter -Drink alcoholic beverages -Take any medication unless instructed by your physician -Make any legal decisions or sign important papers.  Meals: Start with liquid foods such as gelatin or soup. Progress to regular foods as tolerated. Avoid greasy, spicy, heavy foods. If nausea and/or vomiting occur, drink only clear liquids until the nausea and/or vomiting subsides. Call your physician if vomiting continues.  Special Instructions/Symptoms: Your throat may feel dry or sore from the anesthesia or the breathing tube placed in your throat during surgery. If this causes discomfort, gargle with warm salt water. The discomfort should disappear within 24 hours.  If you had a scopolamine patch placed behind your ear for the management of post- operative nausea and/or vomiting:  1. The medication in the patch is effective for 72 hours, after which it should be removed.  Wrap patch in a tissue and discard in the trash. Wash hands thoroughly  with soap and water. 2. You may remove the patch earlier than 72 hours if you experience unpleasant side effects which may include dry mouth, dizziness or visual disturbances. 3. Avoid touching the patch. Wash your hands with soap and water after contact with the patch.     *You had 5mg  of Oxycodone at 3:30pm today.

## 2023-05-14 NOTE — H&P (Signed)
ORTHOPAEDIC SURGERY H&P  Subjective:  The patient presents with right midfoot hardware.   Past Medical History:  Diagnosis Date   Complication of anesthesia    Contusion of rib on right side 10/05/2022   Fx shaft tibia-closed    lt    Past Surgical History:  Procedure Laterality Date   DENTAL SURGERY     GANGLION CYST EXCISION  02/16/2012   Procedure: REMOVAL GANGLION OF WRIST;  Surgeon: Judie Petit. Leonia Corona, MD;  Location: [facility];  Service: Pediatrics;  Laterality: Right;  EXCISION OF GANGLiON CYST OF RIGHT DORSAL WRIST   OPEN REDUCTION INTERNAL FIXATION (ORIF) FOOT LISFRANC FRACTURE Right 10/19/2022   Procedure: Open Reduction Internal Fixation (ORIF) Foot Lisfranc Fracture;  Surgeon: Netta Cedars, MD;  Location: [facility];  Service: Orthopedics;  Laterality: Right;   ORIF ANKLE FRACTURE Right 10/19/2022   Procedure: Open Reduction Internal Fixation (ORIF) Bimalleolar Ankle Fracture;  Surgeon: Netta Cedars, MD;  Location: [facility];  Service: Orthopedics;  Laterality: Right;   SYNDESMOSIS REPAIR Right 10/19/2022   Procedure: Syndesmosis;  Surgeon: Netta Cedars, MD;  Location: [facility];  Service: Orthopedics;  Laterality: Right;     (Not in an outpatient encounter)    No Known Allergies  Social History   Socioeconomic History   Marital status: Single    Spouse name: Not on file   Number of children: Not on file   Years of education: Not on file   Highest education level: Not on file  Occupational History   Not on file  Tobacco Use   Smoking status: Never   Smokeless tobacco: Not on file  Vaping Use   Vaping status: Never Used  Substance and Sexual Activity   Alcohol use: No   Drug use: No   Sexual activity: Never  Other Topics Concern   Not on file  Social History Narrative   Not on file   Social Drivers of Health   Financial Resource Strain: Not on file  Food Insecurity: Patient  Declined (10/05/2022)   Hunger Vital Sign    Worried About Running Out of Food in the Last Year: Patient declined    Ran Out of Food in the Last Year: Patient declined  Transportation Needs: No Transportation Needs (10/05/2022)   PRAPARE - Administrator, Civil Service (Medical): No    Lack of Transportation (Non-Medical): No  Physical Activity: Not on file  Stress: Not on file  Social Connections: Not on file  Intimate Partner Violence: Not At Risk (10/05/2022)   Humiliation, Afraid, Rape, and Kick questionnaire    Fear of Current or Ex-Partner: No    Emotionally Abused: No    Physically Abused: No    Sexually Abused: No     History reviewed. No pertinent family history.   Review of Systems Pertinent items are noted in HPI.  Objective: Vital signs in last 24 hours:    05/08/2023    2:42 PM 11/10/2022    9:54 AM 10/19/2022    3:26 PM  Vitals with BMI  Height 6\' 1"  --   Weight 220 lbs --   BMI 29.03    Systolic  111 118  Diastolic  72 78  Pulse  93 77      EXAM: General: Well nourished, well developed. Awake, alert and oriented to time, place, person. Normal mood and affect. No apparent distress. Breathing room air.  Operative Lower Extremity: Alignment - Neutral Deformity - None  Skin intact Tenderness to palpation - none 5/5 TA, PT, GS, Per, EHL, FHL Sensation intact to light touch throughout Palpable DP and PT pulses Special testing: None  The contralateral foot/ankle was examined for comparison and noted to be neurovascularly intact with no localized deformity, swelling, or tenderness.  Imaging Review All images taken were independently reviewed by me.  Assessment/Plan: The clinical and radiographic findings were reviewed and discussed at length with the patient.  The patient has right midfoot hardware.  We spoke at length about the natural course of these findings. We discussed nonoperative and operative treatment options in detail.  The  risks and benefits were presented and reviewed. The risks due to inability to remove part/all of hardware, recurrent instability, hardware failure/irritation, new/persistent/recurrent infection, stiffness, nerve/vessel/tendon injury, nonunion/malunion of any fracture, wound healing issues, allograft usage, development of arthritis, failure of this surgery, possibility of external fixation in certain situations, possibility of delayed definitive surgery, need for further surgery, prolonged wound care including further soft tissue coverage procedures, thromboembolic events, anesthesia/medical complications/events perioperatively and beyond, amputation, death among others were discussed. The patient acknowledged the explanation and agreed to proceed with the plan.  Netta Cedars  Orthopaedic Surgery [facility]

## 2023-05-17 ENCOUNTER — Other Ambulatory Visit: Payer: Self-pay

## 2023-05-17 ENCOUNTER — Ambulatory Visit (HOSPITAL_BASED_OUTPATIENT_CLINIC_OR_DEPARTMENT_OTHER)
Admission: RE | Admit: 2023-05-17 | Discharge: 2023-05-17 | Disposition: A | Discharge status: Home / Self Care | Payer: Medicaid Other | Attending: Orthopaedic Surgery | Admitting: Orthopaedic Surgery

## 2023-05-17 ENCOUNTER — Encounter (HOSPITAL_BASED_OUTPATIENT_CLINIC_OR_DEPARTMENT_OTHER): Payer: Self-pay | Admitting: Orthopaedic Surgery

## 2023-05-17 ENCOUNTER — Ambulatory Visit (HOSPITAL_BASED_OUTPATIENT_CLINIC_OR_DEPARTMENT_OTHER): Payer: Medicaid Other | Admitting: Anesthesiology

## 2023-05-17 ENCOUNTER — Encounter (HOSPITAL_BASED_OUTPATIENT_CLINIC_OR_DEPARTMENT_OTHER)
Admission: RE | Disposition: A | Discharge status: Home / Self Care | Payer: Self-pay | Source: Home / Self Care | Attending: Orthopaedic Surgery

## 2023-05-17 ENCOUNTER — Other Ambulatory Visit (HOSPITAL_COMMUNITY): Payer: Self-pay

## 2023-05-17 ENCOUNTER — Ambulatory Visit (HOSPITAL_BASED_OUTPATIENT_CLINIC_OR_DEPARTMENT_OTHER): Payer: Medicaid Other

## 2023-05-17 DIAGNOSIS — S93601A Unspecified sprain of right foot, initial encounter: Secondary | ICD-10-CM | POA: Diagnosis not present

## 2023-05-17 DIAGNOSIS — E669 Obesity, unspecified: Secondary | ICD-10-CM | POA: Diagnosis not present

## 2023-05-17 DIAGNOSIS — Z6830 Body mass index (BMI) 30.0-30.9, adult: Secondary | ICD-10-CM | POA: Diagnosis not present

## 2023-05-17 DIAGNOSIS — G90521 Complex regional pain syndrome I of right lower limb: Secondary | ICD-10-CM | POA: Diagnosis not present

## 2023-05-17 DIAGNOSIS — Z472 Encounter for removal of internal fixation device: Secondary | ICD-10-CM | POA: Insufficient documentation

## 2023-05-17 HISTORY — PX: HARDWARE REMOVAL: SHX979

## 2023-05-17 SURGERY — REMOVAL, HARDWARE
Anesthesia: General | Site: Foot | Laterality: Right

## 2023-05-17 MED ORDER — 0.9 % SODIUM CHLORIDE (POUR BTL) OPTIME
TOPICAL | Status: DC | PRN
  Administered 2023-05-17: 200 mL

## 2023-05-17 MED ORDER — MIDAZOLAM HCL 2 MG/2ML IJ SOLN
INTRAMUSCULAR | Status: AC
  Filled 2023-05-17: qty 2

## 2023-05-17 MED ORDER — ASPIRIN 81 MG PO TBEC
81.0000 mg | DELAYED_RELEASE_TABLET | Freq: Two times a day (BID) | ORAL | 0 refills | Status: AC
  Filled 2023-05-17: qty 56, 28d supply, fill #0

## 2023-05-17 MED ORDER — PROPOFOL 10 MG/ML IV BOLUS
INTRAVENOUS | Status: AC
  Filled 2023-05-17: qty 20

## 2023-05-17 MED ORDER — CEFAZOLIN SODIUM-DEXTROSE 2-4 GM/100ML-% IV SOLN
2.0000 g | INTRAVENOUS | Status: AC
  Administered 2023-05-17: 2 g via INTRAVENOUS

## 2023-05-17 MED ORDER — KETOROLAC TROMETHAMINE 30 MG/ML IJ SOLN
INTRAMUSCULAR | Status: AC
  Filled 2023-05-17: qty 1

## 2023-05-17 MED ORDER — FENTANYL CITRATE (PF) 100 MCG/2ML IJ SOLN
INTRAMUSCULAR | Status: AC
  Filled 2023-05-17: qty 2

## 2023-05-17 MED ORDER — DEXMEDETOMIDINE HCL IN NACL 80 MCG/20ML IV SOLN
INTRAVENOUS | Status: DC | PRN
  Administered 2023-05-17: 8 ug via INTRAVENOUS

## 2023-05-17 MED ORDER — GLYCOPYRROLATE PF 0.2 MG/ML IJ SOSY
PREFILLED_SYRINGE | INTRAMUSCULAR | Status: AC
Start: 2023-05-17 — End: ?
  Filled 2023-05-17: qty 1

## 2023-05-17 MED ORDER — OXYCODONE HCL 5 MG/5ML PO SOLN: 5.0000 mg | Freq: Once | ORAL | Status: AC | PRN

## 2023-05-17 MED ORDER — ONDANSETRON HCL 4 MG/2ML IJ SOLN
INTRAMUSCULAR | Status: DC | PRN
  Administered 2023-05-17: 4 mg via INTRAVENOUS

## 2023-05-17 MED ORDER — ONDANSETRON 4 MG PO TBDP
4.0000 mg | ORAL_TABLET | Freq: Three times a day (TID) | ORAL | 0 refills | Status: AC | PRN
  Filled 2023-05-17: qty 15, 5d supply, fill #0

## 2023-05-17 MED ORDER — OXYCODONE HCL 5 MG PO TABS
ORAL_TABLET | ORAL | Status: AC
  Filled 2023-05-17: qty 1

## 2023-05-17 MED ORDER — LACTATED RINGERS IV SOLN: INTRAVENOUS | Status: DC

## 2023-05-17 MED ORDER — KETOROLAC TROMETHAMINE 30 MG/ML IJ SOLN
INTRAMUSCULAR | Status: DC | PRN
  Administered 2023-05-17: 30 mg via INTRAVENOUS

## 2023-05-17 MED ORDER — LIDOCAINE 2% (20 MG/ML) 5 ML SYRINGE
INTRAMUSCULAR | Status: AC
  Filled 2023-05-17: qty 5

## 2023-05-17 MED ORDER — DOCUSATE SODIUM 100 MG PO CAPS
100.0000 mg | ORAL_CAPSULE | Freq: Two times a day (BID) | ORAL | 0 refills | Status: AC
  Filled 2023-05-17: qty 56, 28d supply, fill #0

## 2023-05-17 MED ORDER — LIDOCAINE 2% (20 MG/ML) 5 ML SYRINGE
INTRAMUSCULAR | Status: DC | PRN
  Administered 2023-05-17: 80 mg via INTRAVENOUS

## 2023-05-17 MED ORDER — PROPOFOL 10 MG/ML IV BOLUS
INTRAVENOUS | Status: DC | PRN
  Administered 2023-05-17: 150 ug/kg/min via INTRAVENOUS
  Administered 2023-05-17: 100 mg via INTRAVENOUS
  Administered 2023-05-17: 200 mg via INTRAVENOUS

## 2023-05-17 MED ORDER — ONDANSETRON HCL 4 MG/2ML IJ SOLN: 4.0000 mg | Freq: Once | INTRAMUSCULAR | Status: DC | PRN

## 2023-05-17 MED ORDER — OXYCODONE HCL 5 MG PO TABS
5.0000 mg | ORAL_TABLET | Freq: Once | ORAL | Status: AC | PRN
  Administered 2023-05-17: 5 mg via ORAL

## 2023-05-17 MED ORDER — MIDAZOLAM HCL 5 MG/5ML IJ SOLN
INTRAMUSCULAR | Status: DC | PRN
  Administered 2023-05-17: 2 mg via INTRAVENOUS

## 2023-05-17 MED ORDER — CHLORHEXIDINE GLUCONATE 4 % EX SOLN: 60.0000 mL | Freq: Once | CUTANEOUS | Status: DC

## 2023-05-17 MED ORDER — OXYCODONE HCL 5 MG PO TABS
5.0000 mg | ORAL_TABLET | Freq: Four times a day (QID) | ORAL | 0 refills | Status: AC
  Filled 2023-05-17: qty 28, 7d supply, fill #0

## 2023-05-17 MED ORDER — DEXAMETHASONE SODIUM PHOSPHATE 10 MG/ML IJ SOLN
INTRAMUSCULAR | Status: AC
  Filled 2023-05-17: qty 1

## 2023-05-17 MED ORDER — DEXAMETHASONE SODIUM PHOSPHATE 4 MG/ML IJ SOLN
INTRAMUSCULAR | Status: DC | PRN
  Administered 2023-05-17: 10 mg via INTRAVENOUS

## 2023-05-17 MED ORDER — BUPIVACAINE-EPINEPHRINE 0.5% -1:200000 IJ SOLN
INTRAMUSCULAR | Status: DC | PRN
  Administered 2023-05-17: 10 mL

## 2023-05-17 MED ORDER — ONDANSETRON HCL 4 MG/2ML IJ SOLN
INTRAMUSCULAR | Status: AC
  Filled 2023-05-17: qty 2

## 2023-05-17 MED ORDER — FENTANYL CITRATE (PF) 100 MCG/2ML IJ SOLN
INTRAMUSCULAR | Status: DC | PRN
  Administered 2023-05-17 (×2): 50 ug via INTRAVENOUS

## 2023-05-17 MED ORDER — GLYCOPYRROLATE PF 0.2 MG/ML IJ SOSY
PREFILLED_SYRINGE | INTRAMUSCULAR | Status: DC | PRN
  Administered 2023-05-17: .2 mg via INTRAVENOUS

## 2023-05-17 MED ORDER — HYDROMORPHONE HCL 1 MG/ML IJ SOLN: 0.2500 mg | INTRAMUSCULAR | Status: DC | PRN

## 2023-05-17 MED ORDER — CEFAZOLIN SODIUM-DEXTROSE 2-4 GM/100ML-% IV SOLN
INTRAVENOUS | Status: AC
  Filled 2023-05-17: qty 100

## 2023-05-17 MED ORDER — SODIUM CHLORIDE 0.9 % IV SOLN: INTRAVENOUS | Status: DC | PRN

## 2023-05-17 SURGICAL SUPPLY — 54 items
BANDAGE ESMARK 6X9 LF (GAUZE/BANDAGES/DRESSINGS) ×2 IMPLANT
BLADE SURG 15 STRL LF DISP TIS (BLADE) ×8 IMPLANT
BNDG COHESIVE 4X5 TAN STRL LF (GAUZE/BANDAGES/DRESSINGS) ×2 IMPLANT
BNDG ELASTIC 4INX 5YD STR LF (GAUZE/BANDAGES/DRESSINGS) ×2 IMPLANT
BNDG ELASTIC 6INX 5YD STR LF (GAUZE/BANDAGES/DRESSINGS) IMPLANT
BNDG ESMARK 6X9 LF (GAUZE/BANDAGES/DRESSINGS) ×1
BNDG GAUZE DERMACEA FLUFF 4 (GAUZE/BANDAGES/DRESSINGS) ×2 IMPLANT
BRUSH SCRUB EZ 4% CHG (MISCELLANEOUS) ×2 IMPLANT
CANISTER SUCT 1200ML W/VALVE (MISCELLANEOUS) ×2 IMPLANT
CHLORAPREP W/TINT 26 (MISCELLANEOUS) ×4 IMPLANT
COVER BACK TABLE 60X90IN (DRAPES) ×2 IMPLANT
CUFF TRNQT CYL 34X4.125X (TOURNIQUET CUFF) IMPLANT
DRAPE C-ARM 42X72 X-RAY (DRAPES) IMPLANT
DRAPE C-ARMOR (DRAPES) IMPLANT
DRAPE EXTREMITY T 121X128X90 (DISPOSABLE) ×2 IMPLANT
DRAPE IMP U-DRAPE 54X76 (DRAPES) ×2 IMPLANT
DRAPE OEC MINIVIEW 54X84 (DRAPES) IMPLANT
DRAPE U-SHAPE 47X51 STRL (DRAPES) ×2 IMPLANT
DRSG MEPITEL 4X7.2 (GAUZE/BANDAGES/DRESSINGS) ×2 IMPLANT
ELECT REM PT RETURN 9FT ADLT (ELECTROSURGICAL) ×1
ELECTRODE REM PT RTRN 9FT ADLT (ELECTROSURGICAL) ×2 IMPLANT
GAUZE PAD ABD 8X10 STRL (GAUZE/BANDAGES/DRESSINGS) IMPLANT
GAUZE SPONGE 4X4 12PLY STRL (GAUZE/BANDAGES/DRESSINGS) ×2 IMPLANT
GLOVE BIOGEL PI IND STRL 8 (GLOVE) ×2 IMPLANT
GLOVE SURG SS PI 7.5 STRL IVOR (GLOVE) ×4 IMPLANT
GOWN STRL REUS W/ TWL LRG LVL3 (GOWN DISPOSABLE) ×4 IMPLANT
MARKER SKIN DUAL TIP RULER LAB (MISCELLANEOUS) IMPLANT
NDL HYPO 25X1 1.5 SAFETY (NEEDLE) IMPLANT
NEEDLE HYPO 25X1 1.5 SAFETY (NEEDLE) IMPLANT
NS IRRIG 1000ML POUR BTL (IV SOLUTION) ×2 IMPLANT
PACK BASIN DAY SURGERY FS (CUSTOM PROCEDURE TRAY) ×2 IMPLANT
PAD CAST 4YDX4 CTTN HI CHSV (CAST SUPPLIES) ×2 IMPLANT
PADDING CAST ABS COTTON 4X4 ST (CAST SUPPLIES) IMPLANT
PADDING CAST COTTON 6X4 STRL (CAST SUPPLIES) ×2 IMPLANT
PADDING CAST SYNTHETIC 4X4 STR (CAST SUPPLIES) IMPLANT
PENCIL SMOKE EVACUATOR (MISCELLANEOUS) ×2 IMPLANT
SHEET MEDIUM DRAPE 40X70 STRL (DRAPES) ×2 IMPLANT
SLEEVE SCD COMPRESS KNEE MED (STOCKING) ×2 IMPLANT
SPIKE FLUID TRANSFER (MISCELLANEOUS) IMPLANT
SPLINT PLASTER CAST FAST 5X30 (CAST SUPPLIES) IMPLANT
SPONGE T-LAP 18X18 ~~LOC~~+RFID (SPONGE) ×2 IMPLANT
STOCKINETTE 6 STRL (DRAPES) ×2 IMPLANT
STOCKINETTE ORTHO 6X25 (MISCELLANEOUS) ×2 IMPLANT
SUCTION TUBE FRAZIER 10FR DISP (SUCTIONS) IMPLANT
SUT ETHILON 2 0 FS 18 (SUTURE) ×4 IMPLANT
SUT MNCRL AB 3-0 PS2 18 (SUTURE) ×2 IMPLANT
SUT VIC AB 2-0 SH 27XBRD (SUTURE) ×2 IMPLANT
SUT VIC AB 3-0 SH 27X BRD (SUTURE) IMPLANT
SUT VICRYL 0 SH 27 (SUTURE) IMPLANT
SYR BULB IRRIG 60ML STRL (SYRINGE) ×2 IMPLANT
SYR CONTROL 10ML LL (SYRINGE) IMPLANT
TOWEL GREEN STERILE FF (TOWEL DISPOSABLE) ×4 IMPLANT
TUBE CONNECTING 20X1/4 (TUBING) IMPLANT
UNDERPAD 30X36 HEAVY ABSORB (UNDERPADS AND DIAPERS) ×2 IMPLANT

## 2023-05-17 NOTE — Transfer of Care (Signed)
Immediate Anesthesia Transfer of Care Note  Patient: Wesley Schmidt  Procedure(s) Performed: HARDWARE REMOVAL, right foot (Right: Foot)  Patient Location: PACU  Anesthesia Type:General  Level of Consciousness: drowsy  Airway & Oxygen Therapy: Patient Spontanous Breathing and Patient connected to face mask oxygen  Post-op Assessment: Report given to RN and Post -op Vital signs reviewed and stable  Post vital signs: Reviewed and stable  Last Vitals:  Vitals Value Taken Time  BP 119/69 05/17/23 1100  Temp 36.4 C 05/17/23 1057  Pulse 78 05/17/23 1103  Resp 21 05/17/23 1103  SpO2 100 % 05/17/23 1103  Vitals shown include unfiled device data.  Last Pain:  Vitals:   05/17/23 1057  TempSrc:   PainSc: 0-No pain      Patients Stated Pain Goal: 5 (05/17/23 0844)  Complications: No notable events documented.

## 2023-05-17 NOTE — Op Note (Signed)
05/17/2023  11:59 AM   PATIENT: Wesley Schmidt  28 y.o. male  MRN: [id number]   PRE-OPERATIVE DIAGNOSIS:   Right midfoot hardware s/p ORIF   POST-OPERATIVE DIAGNOSIS:   Same   PROCEDURE: Removal of deep hardware right foot   SURGEON:  Netta Cedars, MD   ASSISTANT: None   ANESTHESIA: General, regional   EBL: Minimal   TOURNIQUET:  5 min   COMPLICATIONS: None apparent   DISPOSITION: Extubated, awake and stable to recovery.   INDICATION FOR PROCEDURE: The patient presented with above diagnosis.  We discussed the diagnosis, alternative treatment options, risks and benefits of the above surgical intervention, as well as alternative non-operative treatments. All questions/concerns were addressed and the patient/family demonstrated appropriate understanding of the diagnosis, the procedure, the postoperative course, and overall prognosis. The patient wished to proceed with surgical intervention and signed an informed surgical consent as such, in each others presence prior to surgery.   PROCEDURE IN DETAIL: After preoperative consent was obtained and the correct operative site was identified, the patient was brought to the operating room supine on stretcher and transferred onto operating table. General anesthesia was induced. Preoperative antibiotics were administered. Surgical timeout was taken. The patient was then positioned supine with an ipsilateral hip bump. The operative lower extremity was prepped and draped in standard sterile fashion. The extremity was exsanguinated with Esmarch tourniquet.  Prior medial approach was utilized over the screw heads and dissection carried down to the level of bone. Two solid 4.0 fully threaded headed screws were removed completely and stability of the midfoot was noted to clinical and fluoroscopic testing.    The surgical sites were thoroughly irrigated. The tourniquet was deflated and hemostasis achieved. The skin was closed  without tension using 2-0 nylon suture.    The leg was cleaned with saline and sterile mepitel dressings with gauze were applied. A well padded sterile wrap was applied. The patient was awakened from anesthesia and transported to the recovery room in stable condition.    FOLLOW UP PLAN: -transfer to PACU, then home -strict NWB operative extremity until nerve block wears off, then OK to WBAT in postop shoe, maximum elevation -maintain dressings until follow up -DVT ppx: Aspirin 81 mg twice daily while NWB -follow up as outpatient within 7-10 days for wound check -sutures out in 2-3 weeks in outpatient office   RADIOGRAPHS: AP, lateral, oblique and stress radiographs of the operative foot were obtained intraoperatively. These showed interval removal of the midfoot screws. Manual stress radiographs were taken and the joints were noted to be stable following hardware removal. No other acute injuries are noted.   Netta Cedars Orthopaedic Surgery [facility]

## 2023-05-17 NOTE — Addendum Note (Signed)
Addendum  created 05/17/23 1551 by Yolanda Bonine, CRNA   Flowsheet accepted

## 2023-05-17 NOTE — Anesthesia Postprocedure Evaluation (Signed)
Anesthesia Post Note  Patient: Wesley Schmidt  Procedure(s) Performed: HARDWARE REMOVAL, right foot (Right: Foot)     Patient location during evaluation: PACU Anesthesia Type: General Level of consciousness: awake and alert and oriented Pain management: pain level controlled Vital Signs Assessment: post-procedure vital signs reviewed and stable Respiratory status: spontaneous breathing, nonlabored ventilation and respiratory function stable Cardiovascular status: blood pressure returned to baseline and stable Postop Assessment: no apparent nausea or vomiting Anesthetic complications: no   No notable events documented.  Last Vitals:  Vitals:   05/17/23 1115 05/17/23 1127  BP: 123/84 117/82  Pulse: 80 76  Resp: (!) 24 19  Temp:    SpO2: 100% 98%    Last Pain:  Vitals:   05/17/23 1127  TempSrc:   PainSc: 5                  Wesley Shinault A.

## 2023-05-17 NOTE — Anesthesia Preprocedure Evaluation (Addendum)
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  Neuromuscular disease  negative psych ROS   GI/Hepatic negative GI ROS, Neg liver ROS,,,  Endo/Other  negative endocrine ROS    Renal/GU negative Renal ROS  negative genitourinary   Musculoskeletal S/P ORIF Bimalleolar Fx right ankle   Abdominal  (+) + obese  Peds  Hematology negative hematology ROS (+)   Anesthesia Other Findings   Reproductive/Obstetrics                             Anesthesia Physical Anesthesia Plan  ASA: 2  Anesthesia Plan: General   Post-op Pain Management: Minimal or no pain anticipated   Induction: Intravenous  PONV Risk Score and Plan: 2 and Treatment may vary due to age or medical condition and Propofol infusion  Airway Management Planned: LMA  Additional Equipment: None  Intra-op Plan:   Post-operative Plan: Extubation in OR  Informed Consent: I have reviewed the patients History and Physical, chart, labs and discussed the procedure including the risks, benefits and alternatives for the proposed anesthesia with the patient or authorized representative who has indicated his/her understanding and acceptance.     Dental advisory given  Plan Discussed with: CRNA and Anesthesiologist  Anesthesia Plan Comments:         Anesthesia Quick Evaluation

## 2023-05-17 NOTE — H&P (Signed)
H&P Update:  -History and Physical Reviewed  -Patient has been re-examined  -No change in the plan of care  -The risks and benefits were presented and reviewed. The risks due to inability to remove part/all of hardware, recurrent instability, hardware/suture failure and/or irritation, new/persistent infection, stiffness, nerve/vessel/tendon injury or rerupture of repaired tendon, nonunion/malunion, allograft usage, wound healing issues, development of arthritis, failure of this surgery, possibility of external fixation with delayed definitive surgery, need for further surgery, thromboembolic events, anesthesia/medical complications, amputation, death among others were discussed. The patient acknowledged the explanation, agreed to proceed with the plan and a consent was signed.  Wesley Schmidt

## 2023-05-17 NOTE — Anesthesia Procedure Notes (Signed)
Procedure Name: LMA Insertion Date/Time: 05/17/2023 10:21 AM  Performed by: Yolanda Bonine, CRNAPre-anesthesia Checklist: Patient identified, Emergency Drugs available, Suction available, Patient being monitored and Timeout performed Patient Re-evaluated:Patient Re-evaluated prior to induction Oxygen Delivery Method: Circle system utilized Preoxygenation: Pre-oxygenation with 100% oxygen Induction Type: IV induction Ventilation: Mask ventilation without difficulty LMA: LMA inserted LMA Size: 5.0 Dental Injury: Teeth and Oropharynx as per pre-operative assessment

## 2023-05-18 ENCOUNTER — Encounter (HOSPITAL_BASED_OUTPATIENT_CLINIC_OR_DEPARTMENT_OTHER): Payer: Self-pay | Admitting: Orthopaedic Surgery

## 2023-05-29 ENCOUNTER — Encounter (HOSPITAL_BASED_OUTPATIENT_CLINIC_OR_DEPARTMENT_OTHER): Payer: Self-pay | Admitting: Emergency Medicine

## 2023-05-29 ENCOUNTER — Emergency Department (HOSPITAL_BASED_OUTPATIENT_CLINIC_OR_DEPARTMENT_OTHER): Payer: Medicaid Other | Admitting: Radiology

## 2023-05-29 DIAGNOSIS — W134XXA Fall from, out of or through window, initial encounter: Secondary | ICD-10-CM | POA: Diagnosis not present

## 2023-05-29 DIAGNOSIS — Z5321 Procedure and treatment not carried out due to patient leaving prior to being seen by health care provider: Secondary | ICD-10-CM | POA: Diagnosis not present

## 2023-05-29 DIAGNOSIS — S6991XA Unspecified injury of right wrist, hand and finger(s), initial encounter: Secondary | ICD-10-CM | POA: Diagnosis present

## 2023-05-29 DIAGNOSIS — S61411A Laceration without foreign body of right hand, initial encounter: Secondary | ICD-10-CM | POA: Insufficient documentation

## 2023-05-29 NOTE — ED Triage Notes (Signed)
 Fell through glass window, right hand lac, glass imbedded Happened around 8pm Patient takes daily 81mg  BID Bleeding Tetanus UTD per patient

## 2023-05-30 ENCOUNTER — Emergency Department (HOSPITAL_BASED_OUTPATIENT_CLINIC_OR_DEPARTMENT_OTHER)
Admission: EM | Admit: 2023-05-30 | Discharge: 2023-05-30 | Disposition: A | Discharge status: Home / Self Care | Payer: Medicaid Other | Attending: Emergency Medicine | Admitting: Emergency Medicine

## 2023-05-30 ENCOUNTER — Other Ambulatory Visit (HOSPITAL_COMMUNITY): Payer: Self-pay

## 2023-05-30 MED ORDER — CEPHALEXIN 500 MG PO CAPS
500.0000 mg | ORAL_CAPSULE | Freq: Four times a day (QID) | ORAL | 0 refills | Status: AC
  Filled 2023-05-30: qty 40, 10d supply, fill #0

## 2023-05-30 NOTE — ED Notes (Signed)
Pt not in lobby when called to room. Per registration, pt left department.

## 2023-06-05 ENCOUNTER — Other Ambulatory Visit (HOSPITAL_COMMUNITY): Payer: Self-pay

## 2023-07-04 ENCOUNTER — Other Ambulatory Visit (HOSPITAL_COMMUNITY): Payer: Self-pay

## 2023-07-04 MED ORDER — MELOXICAM 7.5 MG PO TABS
7.5000 mg | ORAL_TABLET | Freq: Every day | ORAL | 1 refills | Status: AC | PRN
  Filled 2023-07-04: qty 30, 30d supply, fill #0

## 2023-07-05 ENCOUNTER — Other Ambulatory Visit (HOSPITAL_COMMUNITY): Payer: Self-pay

## 2023-10-05 ENCOUNTER — Other Ambulatory Visit (HOSPITAL_COMMUNITY): Payer: Self-pay

## 2023-10-05 MED ORDER — MELOXICAM 7.5 MG PO TABS
7.5000 mg | ORAL_TABLET | Freq: Two times a day (BID) | ORAL | 0 refills | Status: AC
Start: 2023-10-05 — End: ?
  Filled 2023-10-05: qty 60, 30d supply, fill #0

## 2023-10-17 ENCOUNTER — Other Ambulatory Visit (HOSPITAL_COMMUNITY): Payer: Self-pay

## 2023-10-18 ENCOUNTER — Other Ambulatory Visit (HOSPITAL_COMMUNITY): Payer: Self-pay

## 2023-10-18 MED ORDER — AMOXICILLIN-POT CLAVULANATE 500-125 MG PO TABS
1.0000 | ORAL_TABLET | Freq: Three times a day (TID) | ORAL | 0 refills | Status: AC
  Filled 2023-10-18 – 2023-10-30 (×2): qty 30, 10d supply, fill #0

## 2023-10-30 ENCOUNTER — Other Ambulatory Visit (HOSPITAL_COMMUNITY): Payer: Self-pay

## 2023-12-20 ENCOUNTER — Other Ambulatory Visit (HOSPITAL_COMMUNITY): Payer: Self-pay

## 2023-12-20 MED ORDER — ACETAMINOPHEN-CODEINE 300-30 MG PO TABS
1.0000 | ORAL_TABLET | Freq: Four times a day (QID) | ORAL | 0 refills | Status: AC | PRN
  Filled 2023-12-20: qty 10, 3d supply, fill #0

## 2023-12-20 MED ORDER — CHLORHEXIDINE GLUCONATE 0.12 % MT SOLN
OROMUCOSAL | 0 refills | Status: AC
  Filled 2023-12-20: qty 473, 16d supply, fill #0

## 2023-12-20 MED ORDER — IBUPROFEN 800 MG PO TABS
800.0000 mg | ORAL_TABLET | Freq: Three times a day (TID) | ORAL | 1 refills | Status: AC | PRN
  Filled 2023-12-20: qty 20, 7d supply, fill #0

## 2023-12-20 MED ORDER — AMOXICILLIN 500 MG PO CAPS
500.0000 mg | ORAL_CAPSULE | Freq: Three times a day (TID) | ORAL | 0 refills | Status: AC
  Filled 2023-12-20: qty 21, 7d supply, fill #0

## 2024-02-19 ENCOUNTER — Ambulatory Visit: Payer: Self-pay | Admitting: Nurse Practitioner

## 2024-04-05 ENCOUNTER — Other Ambulatory Visit (HOSPITAL_COMMUNITY): Payer: Self-pay

## 2024-04-05 MED ORDER — PREDNISONE 20 MG PO TABS
40.0000 mg | ORAL_TABLET | Freq: Every day | ORAL | 0 refills | Status: AC
  Filled 2024-04-05: qty 10, 5d supply, fill #0

## 2024-04-05 MED ORDER — AZITHROMYCIN 250 MG PO TABS
ORAL_TABLET | ORAL | 0 refills | Status: AC
  Filled 2024-04-05: qty 6, 5d supply, fill #0

## 2024-05-03 ENCOUNTER — Other Ambulatory Visit (HOSPITAL_COMMUNITY): Payer: Self-pay

## 2024-05-03 MED ORDER — PROMETHAZINE-DM 6.25-15 MG/5ML PO SYRP
5.0000 mL | ORAL_SOLUTION | ORAL | 0 refills | Status: AC
  Filled 2024-05-03: qty 210, 7d supply, fill #0

## 2024-05-03 MED ORDER — AMOXICILLIN 875 MG PO TABS
875.0000 mg | ORAL_TABLET | Freq: Two times a day (BID) | ORAL | 0 refills | Status: AC
  Filled 2024-05-03: qty 20, 10d supply, fill #0
